# Patient Record
Sex: Female | Born: 1955
Health system: Southern US, Community
[De-identification: ages and names within clinical notes are randomized; demographics above are authoritative.]

## PROBLEM LIST (undated history)

## (undated) DIAGNOSIS — Z95810 Presence of automatic (implantable) cardiac defibrillator: Secondary | ICD-10-CM

## (undated) DIAGNOSIS — I5032 Chronic diastolic (congestive) heart failure: Secondary | ICD-10-CM

## (undated) DIAGNOSIS — Z5189 Encounter for other specified aftercare: Secondary | ICD-10-CM

## (undated) DIAGNOSIS — M1990 Unspecified osteoarthritis, unspecified site: Secondary | ICD-10-CM

## (undated) DIAGNOSIS — E539 Vitamin B deficiency, unspecified: Secondary | ICD-10-CM

## (undated) DIAGNOSIS — R55 Syncope and collapse: Secondary | ICD-10-CM

## (undated) DIAGNOSIS — N289 Disorder of kidney and ureter, unspecified: Secondary | ICD-10-CM

## (undated) DIAGNOSIS — R002 Palpitations: Secondary | ICD-10-CM

## (undated) DIAGNOSIS — J45909 Unspecified asthma, uncomplicated: Secondary | ICD-10-CM

## (undated) DIAGNOSIS — I421 Obstructive hypertrophic cardiomyopathy: Secondary | ICD-10-CM

## (undated) DIAGNOSIS — I4891 Unspecified atrial fibrillation: Secondary | ICD-10-CM

## (undated) DIAGNOSIS — M8580 Other specified disorders of bone density and structure, unspecified site: Secondary | ICD-10-CM

## (undated) DIAGNOSIS — Z9889 Other specified postprocedural states: Secondary | ICD-10-CM

## (undated) DIAGNOSIS — E785 Hyperlipidemia, unspecified: Secondary | ICD-10-CM

## (undated) DIAGNOSIS — F419 Anxiety disorder, unspecified: Secondary | ICD-10-CM

## (undated) HISTORY — DX: Encounter for other specified aftercare: Z51.89

## (undated) HISTORY — PX: EYE SURGERY: SHX253

## (undated) HISTORY — DX: Obstructive hypertrophic cardiomyopathy: I42.1

## (undated) HISTORY — PX: BREAST CYST ASPIRATION: SHX578

## (undated) HISTORY — PX: TONSILLECTOMY AND ADENOIDECTOMY: SHX28

## (undated) HISTORY — DX: Unspecified asthma, uncomplicated: J45.909

## (undated) HISTORY — DX: Disorder of kidney and ureter, unspecified: N28.9

## (undated) HISTORY — DX: Other specified disorders of bone density and structure, unspecified site: M85.80

## (undated) HISTORY — DX: Hyperlipidemia, unspecified: E78.5

## (undated) HISTORY — PX: UMBILICAL HERNIA REPAIR: SHX196

## (undated) HISTORY — PX: HERNIA REPAIR: SHX51

## (undated) HISTORY — DX: Unspecified osteoarthritis, unspecified site: M19.90

## (undated) HISTORY — DX: Other specified postprocedural states: Z98.890

## (undated) HISTORY — PX: BONY PELVIS SURGERY: SHX572

## (undated) HISTORY — DX: Presence of automatic (implantable) cardiac defibrillator: Z95.810

## (undated) HISTORY — DX: Anxiety disorder, unspecified: F41.9

## (undated) HISTORY — DX: Vitamin B deficiency, unspecified: E53.9

## (undated) HISTORY — PX: BREAST BIOPSY: SHX20

## (undated) HISTORY — DX: Palpitations: R00.2

## (undated) HISTORY — DX: Unspecified atrial fibrillation: I48.91

---
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
## 2012-07-07 ENCOUNTER — Ambulatory Visit: Payer: Self-pay | Admitting: Internal Medicine

## 2012-10-18 ENCOUNTER — Ambulatory Visit: Payer: Self-pay | Admitting: Internal Medicine

## 2014-06-24 ENCOUNTER — Ambulatory Visit: Payer: Self-pay | Admitting: Internal Medicine

## 2014-11-22 ENCOUNTER — Other Ambulatory Visit (INDEPENDENT_AMBULATORY_CARE_PROVIDER_SITE_OTHER): Payer: BLUE CROSS/BLUE SHIELD

## 2014-11-22 DIAGNOSIS — Z23 Encounter for immunization: Secondary | ICD-10-CM | POA: Diagnosis not present

## 2015-01-30 HISTORY — PX: CARDIAC DEFIBRILLATOR PLACEMENT: SHX171

## 2015-02-22 DIAGNOSIS — S329XXA Fracture of unspecified parts of lumbosacral spine and pelvis, initial encounter for closed fracture: Secondary | ICD-10-CM | POA: Insufficient documentation

## 2015-02-23 DIAGNOSIS — R55 Syncope and collapse: Secondary | ICD-10-CM

## 2015-02-23 HISTORY — DX: Syncope and collapse: R55

## 2015-02-27 DIAGNOSIS — I421 Obstructive hypertrophic cardiomyopathy: Secondary | ICD-10-CM | POA: Insufficient documentation

## 2015-03-05 ENCOUNTER — Other Ambulatory Visit: Payer: Self-pay | Admitting: Internal Medicine

## 2015-03-05 DIAGNOSIS — M810 Age-related osteoporosis without current pathological fracture: Secondary | ICD-10-CM | POA: Insufficient documentation

## 2015-03-05 DIAGNOSIS — E785 Hyperlipidemia, unspecified: Secondary | ICD-10-CM | POA: Insufficient documentation

## 2015-03-05 DIAGNOSIS — M8580 Other specified disorders of bone density and structure, unspecified site: Secondary | ICD-10-CM

## 2015-03-05 DIAGNOSIS — Z87898 Personal history of other specified conditions: Secondary | ICD-10-CM | POA: Insufficient documentation

## 2015-03-06 ENCOUNTER — Encounter: Payer: Self-pay | Admitting: Internal Medicine

## 2015-03-06 DIAGNOSIS — J4520 Mild intermittent asthma, uncomplicated: Secondary | ICD-10-CM | POA: Insufficient documentation

## 2015-03-07 ENCOUNTER — Ambulatory Visit (INDEPENDENT_AMBULATORY_CARE_PROVIDER_SITE_OTHER): Payer: BLUE CROSS/BLUE SHIELD | Admitting: Internal Medicine

## 2015-03-07 ENCOUNTER — Encounter: Payer: Self-pay | Admitting: Internal Medicine

## 2015-03-07 VITALS — BP 110/60 | HR 72 | Ht 65.0 in | Wt 143.0 lb

## 2015-03-07 DIAGNOSIS — S3289XA Fracture of other parts of pelvis, initial encounter for closed fracture: Secondary | ICD-10-CM

## 2015-03-07 DIAGNOSIS — I421 Obstructive hypertrophic cardiomyopathy: Secondary | ICD-10-CM | POA: Diagnosis not present

## 2015-03-07 DIAGNOSIS — IMO0001 Reserved for inherently not codable concepts without codable children: Secondary | ICD-10-CM | POA: Insufficient documentation

## 2015-03-07 DIAGNOSIS — J4520 Mild intermittent asthma, uncomplicated: Secondary | ICD-10-CM | POA: Diagnosis not present

## 2015-03-07 DIAGNOSIS — F419 Anxiety disorder, unspecified: Secondary | ICD-10-CM

## 2015-03-07 DIAGNOSIS — F32A Depression, unspecified: Secondary | ICD-10-CM | POA: Insufficient documentation

## 2015-03-07 DIAGNOSIS — F430 Acute stress reaction: Secondary | ICD-10-CM

## 2015-03-07 DIAGNOSIS — F324 Major depressive disorder, single episode, in partial remission: Secondary | ICD-10-CM | POA: Insufficient documentation

## 2015-03-07 DIAGNOSIS — F329 Major depressive disorder, single episode, unspecified: Secondary | ICD-10-CM | POA: Insufficient documentation

## 2015-03-07 MED ORDER — OXYCODONE HCL 5 MG PO TABS: 5.0000 mg | ORAL_TABLET | ORAL | Status: DC | PRN

## 2015-03-07 MED ORDER — ONDANSETRON HCL 4 MG PO TABS: 4.0000 mg | ORAL_TABLET | Freq: Three times a day (TID) | ORAL | Status: DC | PRN

## 2015-03-07 MED ORDER — SERTRALINE HCL 25 MG PO TABS: 25.0000 mg | ORAL_TABLET | Freq: Every day | ORAL | Status: DC

## 2015-03-07 MED ORDER — LORAZEPAM 0.5 MG PO TABS: 0.5000 mg | ORAL_TABLET | Freq: Every day | ORAL | Status: DC
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
## 2015-03-08 LAB — CBC WITH DIFFERENTIAL/PLATELET
Basophils Absolute: 0 10*3/uL (ref 0.0–0.2)
Basos: 1 %
EOS (ABSOLUTE): 0.1 10*3/uL (ref 0.0–0.4)
EOS: 2 %
HEMATOCRIT: 31.9 % — AB (ref 34.0–46.6)
HEMOGLOBIN: 10.2 g/dL — AB (ref 11.1–15.9)
IMMATURE GRANS (ABS): 0 10*3/uL (ref 0.0–0.1)
IMMATURE GRANULOCYTES: 0 %
Lymphocytes Absolute: 1.1 10*3/uL (ref 0.7–3.1)
Lymphs: 18 %
MCH: 30.3 pg (ref 26.6–33.0)
MCHC: 32 g/dL (ref 31.5–35.7)
MCV: 95 fL (ref 79–97)
Monocytes Absolute: 0.4 10*3/uL (ref 0.1–0.9)
Monocytes: 7 %
NEUTROS PCT: 72 %
Neutrophils Absolute: 4.3 10*3/uL (ref 1.4–7.0)
Platelets: 362 10*3/uL (ref 150–379)
RBC: 3.37 x10E6/uL — AB (ref 3.77–5.28)
RDW: 13.5 % (ref 12.3–15.4)
WBC: 5.9 10*3/uL (ref 3.4–10.8)

## 2015-03-08 LAB — COMPREHENSIVE METABOLIC PANEL
ALBUMIN: 3.7 g/dL (ref 3.6–4.8)
ALT: 38 IU/L — ABNORMAL HIGH (ref 0–32)
AST: 20 IU/L (ref 0–40)
Albumin/Globulin Ratio: 1.9 (ref 1.1–2.5)
Alkaline Phosphatase: 224 IU/L — ABNORMAL HIGH (ref 39–117)
BUN / CREAT RATIO: 14 (ref 11–26)
BUN: 11 mg/dL (ref 8–27)
Bilirubin Total: 0.4 mg/dL (ref 0.0–1.2)
CALCIUM: 8.9 mg/dL (ref 8.7–10.3)
CO2: 27 mmol/L (ref 18–29)
CREATININE: 0.8 mg/dL (ref 0.57–1.00)
Chloride: 102 mmol/L (ref 97–108)
GFR calc Af Amer: 92 mL/min/{1.73_m2} (ref >59)
GFR, EST NON AFRICAN AMERICAN: 80 mL/min/{1.73_m2} (ref >59)
GLOBULIN, TOTAL: 2 g/dL (ref 1.5–4.5)
Glucose: 94 mg/dL (ref 65–99)
Potassium: 4.8 mmol/L (ref 3.5–5.2)
SODIUM: 143 mmol/L (ref 134–144)
TOTAL PROTEIN: 5.7 g/dL — AB (ref 6.0–8.5)

## 2015-03-13 ENCOUNTER — Encounter (INDEPENDENT_AMBULATORY_CARE_PROVIDER_SITE_OTHER): Payer: BLUE CROSS/BLUE SHIELD | Admitting: Internal Medicine

## 2015-03-13 ENCOUNTER — Encounter: Payer: Self-pay | Admitting: Internal Medicine

## 2015-03-13 DIAGNOSIS — S3289XA Fracture of other parts of pelvis, initial encounter for closed fracture: Secondary | ICD-10-CM

## 2015-03-13 DIAGNOSIS — I421 Obstructive hypertrophic cardiomyopathy: Secondary | ICD-10-CM

## 2015-03-13 DIAGNOSIS — Z95810 Presence of automatic (implantable) cardiac defibrillator: Secondary | ICD-10-CM

## 2015-03-13 DIAGNOSIS — R55 Syncope and collapse: Secondary | ICD-10-CM

## 2015-03-13 HISTORY — DX: Presence of automatic (implantable) cardiac defibrillator: Z95.810

## 2015-03-13 NOTE — Progress Notes (Signed)
Patient ID: Carmen Graves, female   DOB: [birth year], 59 y.o.   MRN: [id number]  Home health orders from [facility] are received. Start of care date 02/28/2015. Certification from 57/50/5183 through 04/28/2015. Orders are reviewed and signed and dated. Orders will be faxed to the agency.

## 2015-03-21 ENCOUNTER — Telehealth: Payer: Self-pay

## 2015-03-21 ENCOUNTER — Other Ambulatory Visit: Payer: Self-pay | Admitting: Internal Medicine

## 2015-03-21 DIAGNOSIS — S3289XA Fracture of other parts of pelvis, initial encounter for closed fracture: Secondary | ICD-10-CM

## 2015-03-21 MED ORDER — LORAZEPAM 0.5 MG PO TABS: 0.5000 mg | ORAL_TABLET | Freq: Two times a day (BID) | ORAL | Status: AC | PRN

## 2015-03-21 NOTE — Telephone Encounter (Signed)
Patient states having a lot of anxiety, cannot ride in car without being very anxious, nauseated, long halls at [facility] with no windows make her anxious as do exam room with no windows. Needs Lorazepam more than once a day. Can you call in new Rx.dr

## 2015-04-04 ENCOUNTER — Other Ambulatory Visit: Payer: Self-pay

## 2015-04-04 ENCOUNTER — Telehealth: Payer: Self-pay

## 2015-04-04 DIAGNOSIS — S3289XA Fracture of other parts of pelvis, initial encounter for closed fracture: Secondary | ICD-10-CM

## 2015-04-04 MED ORDER — ONDANSETRON HCL 4 MG PO TABS: 4.0000 mg | ORAL_TABLET | Freq: Three times a day (TID) | ORAL | Status: DC | PRN

## 2015-04-04 NOTE — Telephone Encounter (Signed)
Called about Zofran but was already called in. I informed patient  To check pharmacy. [facility]

## 2015-05-08 DIAGNOSIS — R898 Other abnormal findings in specimens from other organs, systems and tissues: Secondary | ICD-10-CM | POA: Insufficient documentation

## 2015-06-01 HISTORY — PX: MYOMECTOMY: SHX85

## 2015-06-20 ENCOUNTER — Other Ambulatory Visit: Payer: Self-pay | Admitting: Internal Medicine

## 2015-06-20 ENCOUNTER — Encounter: Payer: Self-pay | Admitting: Internal Medicine

## 2015-06-20 DIAGNOSIS — I480 Paroxysmal atrial fibrillation: Secondary | ICD-10-CM | POA: Insufficient documentation

## 2015-06-24 DIAGNOSIS — Z95810 Presence of automatic (implantable) cardiac defibrillator: Secondary | ICD-10-CM | POA: Insufficient documentation

## 2015-07-15 ENCOUNTER — Ambulatory Visit: Payer: BLUE CROSS/BLUE SHIELD | Admitting: Internal Medicine

## 2015-07-18 ENCOUNTER — Ambulatory Visit (INDEPENDENT_AMBULATORY_CARE_PROVIDER_SITE_OTHER): Payer: BLUE CROSS/BLUE SHIELD | Admitting: Internal Medicine

## 2015-07-18 ENCOUNTER — Encounter: Payer: Self-pay | Admitting: Internal Medicine

## 2015-07-18 ENCOUNTER — Other Ambulatory Visit: Payer: Self-pay | Admitting: Internal Medicine

## 2015-07-18 VITALS — BP 104/62 | HR 64 | Ht 65.0 in | Wt 134.2 lb

## 2015-07-18 DIAGNOSIS — F32A Depression, unspecified: Secondary | ICD-10-CM

## 2015-07-18 DIAGNOSIS — F418 Other specified anxiety disorders: Secondary | ICD-10-CM | POA: Diagnosis not present

## 2015-07-18 DIAGNOSIS — Z8679 Personal history of other diseases of the circulatory system: Secondary | ICD-10-CM

## 2015-07-18 DIAGNOSIS — I421 Obstructive hypertrophic cardiomyopathy: Secondary | ICD-10-CM | POA: Diagnosis not present

## 2015-07-18 DIAGNOSIS — Z95810 Presence of automatic (implantable) cardiac defibrillator: Secondary | ICD-10-CM

## 2015-07-18 DIAGNOSIS — F419 Anxiety disorder, unspecified: Secondary | ICD-10-CM

## 2015-07-18 DIAGNOSIS — I4891 Unspecified atrial fibrillation: Secondary | ICD-10-CM

## 2015-07-18 DIAGNOSIS — S3289XD Fracture of other parts of pelvis, subsequent encounter for fracture with routine healing: Secondary | ICD-10-CM

## 2015-07-18 DIAGNOSIS — F329 Major depressive disorder, single episode, unspecified: Secondary | ICD-10-CM
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
## 2015-09-29 DIAGNOSIS — I421 Obstructive hypertrophic cardiomyopathy: Secondary | ICD-10-CM | POA: Diagnosis not present

## 2015-10-03 DIAGNOSIS — Z4502 Encounter for adjustment and management of automatic implantable cardiac defibrillator: Secondary | ICD-10-CM | POA: Diagnosis not present

## 2015-10-03 DIAGNOSIS — Z79899 Other long term (current) drug therapy: Secondary | ICD-10-CM | POA: Diagnosis not present

## 2015-10-03 DIAGNOSIS — I421 Obstructive hypertrophic cardiomyopathy: Secondary | ICD-10-CM | POA: Diagnosis not present

## 2015-10-03 DIAGNOSIS — Z95810 Presence of automatic (implantable) cardiac defibrillator: Secondary | ICD-10-CM | POA: Diagnosis not present

## 2015-10-13 DIAGNOSIS — Z95810 Presence of automatic (implantable) cardiac defibrillator: Secondary | ICD-10-CM | POA: Diagnosis not present

## 2015-10-13 DIAGNOSIS — I421 Obstructive hypertrophic cardiomyopathy: Secondary | ICD-10-CM | POA: Diagnosis not present

## 2015-10-13 DIAGNOSIS — Z7901 Long term (current) use of anticoagulants: Secondary | ICD-10-CM | POA: Diagnosis not present

## 2015-10-13 DIAGNOSIS — Z79899 Other long term (current) drug therapy: Secondary | ICD-10-CM | POA: Diagnosis not present

## 2015-10-16 DIAGNOSIS — I421 Obstructive hypertrophic cardiomyopathy: Secondary | ICD-10-CM | POA: Diagnosis not present

## 2015-10-23 ENCOUNTER — Other Ambulatory Visit: Payer: Self-pay | Admitting: Internal Medicine

## 2016-01-09 DIAGNOSIS — Z45018 Encounter for adjustment and management of other part of cardiac pacemaker: Secondary | ICD-10-CM | POA: Diagnosis not present

## 2016-01-09 DIAGNOSIS — Z95810 Presence of automatic (implantable) cardiac defibrillator: Secondary | ICD-10-CM | POA: Diagnosis not present

## 2016-01-15 ENCOUNTER — Encounter (INDEPENDENT_AMBULATORY_CARE_PROVIDER_SITE_OTHER): Payer: Self-pay

## 2016-01-15 ENCOUNTER — Ambulatory Visit (INDEPENDENT_AMBULATORY_CARE_PROVIDER_SITE_OTHER): Payer: BLUE CROSS/BLUE SHIELD | Admitting: Internal Medicine

## 2016-01-15 ENCOUNTER — Encounter: Payer: Self-pay | Admitting: Internal Medicine

## 2016-01-15 VITALS — BP 102/78 | HR 78 | Resp 16 | Ht 64.0 in | Wt 136.0 lb

## 2016-01-15 DIAGNOSIS — Z0000 Encounter for general adult medical examination without abnormal findings: Secondary | ICD-10-CM

## 2016-01-15 DIAGNOSIS — I480 Paroxysmal atrial fibrillation: Secondary | ICD-10-CM

## 2016-01-15 DIAGNOSIS — M810 Age-related osteoporosis without current pathological fracture: Secondary | ICD-10-CM | POA: Diagnosis not present

## 2016-01-15 DIAGNOSIS — Z1211 Encounter for screening for malignant neoplasm of colon: Secondary | ICD-10-CM

## 2016-01-15 DIAGNOSIS — F418 Other specified anxiety disorders: Secondary | ICD-10-CM | POA: Diagnosis not present

## 2016-01-15 DIAGNOSIS — Z23 Encounter for immunization: Secondary | ICD-10-CM

## 2016-01-15 DIAGNOSIS — I421 Obstructive hypertrophic cardiomyopathy: Secondary | ICD-10-CM

## 2016-01-15 DIAGNOSIS — Z1239 Encounter for other screening for malignant neoplasm of breast: Secondary | ICD-10-CM | POA: Diagnosis not present

## 2016-01-15 DIAGNOSIS — Z8679 Personal history of other diseases of the circulatory system: Secondary | ICD-10-CM | POA: Diagnosis not present

## 2016-01-15 DIAGNOSIS — F329 Major depressive disorder, single episode, unspecified: Secondary | ICD-10-CM

## 2016-01-15 DIAGNOSIS — Z1159 Encounter for screening for other viral diseases: Secondary | ICD-10-CM

## 2016-01-15 DIAGNOSIS — F419 Anxiety disorder, unspecified: Secondary | ICD-10-CM

## 2016-01-15 DIAGNOSIS — F32A Depression, unspecified: Secondary | ICD-10-CM

## 2016-01-15 NOTE — Patient Instructions (Addendum)
Pneumococcal Conjugate Vaccine (PCV13)   1. Why get vaccinated?  Vaccination can protect both children and adults from pneumococcal disease.  Pneumococcal disease is caused by bacteria that can spread from person to person through close contact. It can cause ear infections, and it can also lead to more serious infections of the:  · Lungs (pneumonia),  · Blood (bacteremia), and  · Covering of the brain and spinal cord (meningitis).  Pneumococcal pneumonia is most common among adults. Pneumococcal meningitis can cause deafness and brain damage, and it kills about 1 child in 10 who get it.  Anyone can get pneumococcal disease, but children under 2 years of age and adults 65 years and older, people with certain medical conditions, and cigarette smokers are at the highest risk.  Before there was a vaccine, the [place] saw:  · more than 700 cases of meningitis,  · about 13,000 blood infections,  · about 5 million ear infections, and  · about 200 deaths  in children under 5 each year from pneumococcal disease. Since vaccine became available, severe pneumococcal disease in these children has fallen by 88%.  About 18,000 older adults die of pneumococcal disease each year in the [place].  Treatment of pneumococcal infections with penicillin and other drugs is not as effective as it used to be, because some strains of the disease have become resistant to these drugs. This makes prevention of the disease, through vaccination, even more important.  2. PCV13 vaccine  Pneumococcal conjugate vaccine (called PCV13) protects against 13 types of pneumococcal bacteria.  PCV13 is routinely given to children at 2, 4, 6, and 12-15 months of age. It is also recommended for children and adults 2 to 64 years of age with certain health conditions, and for all adults 65 years of age and older. Your doctor can give you details.  3. Some people should not get this vaccine  Anyone who has ever had a life-threatening allergic reaction  to a dose of this vaccine, to an earlier pneumococcal vaccine called PCV7, or to any vaccine containing diphtheria toxoid (for example, DTaP), should not get PCV13.  Anyone with a severe allergy to any component of PCV13 should not get the vaccine. Tell your doctor if the person being vaccinated has any severe allergies.  If the person scheduled for vaccination is not feeling well, your healthcare provider might decide to reschedule the shot on another day.  4. Risks of a vaccine reaction  With any medicine, including vaccines, there is a chance of reactions. These are usually mild and go away on their own, but serious reactions are also possible.  Problems reported following PCV13 varied by age and dose in the series. The most common problems reported among children were:  · About half became drowsy after the shot, had a temporary loss of appetite, or had redness or tenderness where the shot was given.  · About 1 out of 3 had swelling where the shot was given.  · About 1 out of 3 had a mild fever, and about 1 in 20 had a fever over 102.2°F.  · Up to about 8 out of 10 became fussy or irritable.  Adults have reported pain, redness, and swelling where the shot was given; also mild fever, fatigue, headache, chills, or muscle pain.  Young children who get PCV13 along with inactivated flu vaccine at the same time may be at increased risk for seizures caused by fever. Ask your doctor for more information.  Problems that   could happen after any vaccine:  · People sometimes faint after a medical procedure, including vaccination. Sitting or lying down for about 15 minutes can help prevent fainting, and injuries caused by a fall. Tell your doctor if you feel dizzy, or have vision changes or ringing in the ears.  · Some older children and adults get severe pain in the shoulder and have difficulty moving the arm where a shot was given. This happens very rarely.  · Any medication can cause a severe allergic reaction. Such  reactions from a vaccine are very rare, estimated at about 1 in a million doses, and would happen within a few minutes to a few hours after the vaccination.  As with any medicine, there is a very small chance of a vaccine causing a serious injury or death.  The safety of vaccines is always being monitored. For more information, visit: www.cdc.gov/vaccinesafety/  5. What if there is a serious reaction?  What should I look for?  · Look for anything that concerns you, such as signs of a severe allergic reaction, very high fever, or unusual behavior.  Signs of a severe allergic reaction can include hives, swelling of the face and throat, difficulty breathing, a fast heartbeat, dizziness, and weakness-usually within a few minutes to a few hours after the vaccination.  What should I do?  · If you think it is a severe allergic reaction or other emergency that can't wait, call 9-1-1 or get the person to the nearest hospital. Otherwise, call your doctor.  Reactions should be reported to the Vaccine Adverse Event Reporting System (VAERS). Your doctor should file this report, or you can do it yourself through the VAERS web site at www.vaers.hhs.gov, or by calling 1-800-822-7967.  VAERS does not give medical advice.  6. The National Vaccine Injury Compensation Program  The National Vaccine Injury Compensation Program (VICP) is a federal program that was created to compensate people who may have been injured by certain vaccines.  Persons who believe they may have been injured by a vaccine can learn about the program and about filing a claim by calling 1-800-338-2382 or visiting the VICP website at www.hrsa.gov/vaccinecompensation. There is a time limit to file a claim for compensation.  7. How can I learn more?  · Ask your healthcare provider. He or she can give you the vaccine package insert or suggest other sources of information.  · Call your local or state health department.  · Contact the Centers for Disease Control and  Prevention (CDC):    Call 1-800-232-4636 (1-800-CDC-INFO) or    Visit CDC's website at www.cdc.gov/vaccines  Vaccine Information Statement  PCV13 Vaccine (04/04/2014)     This information is not intended to replace advice given to you by your health care provider. Make sure you discuss any questions you have with your health care provider.     Document Released: 03/14/2006 Document Revised: 06/07/2014 Document Reviewed: 04/11/2014  Elsevier Interactive Patient Education ©2016 [employer]

## 2016-01-15 NOTE — Progress Notes (Signed)
Date:  01/15/2016   Name:  Carmen Graves   DOB:  1955/07/22   MRN:  [id number]  Chief Complaint: Annual Exam (DeclinesPAP nad Mammo will do at OB/GYN) Carmen Graves is a 60 y.o. female who presents today for her Complete Annual Exam. She feels fairly well. She reports exercising very little due to heart condition.. She reports she is sleeping fairly well.  She plans to go to the OB-GYN for Pap and Mammogram.  HOCM - she is doing much better on medication and with a defibrillator in place. However she continues to have significant fatigue shortness of breath with much exertion. She continues to see the cardiologist and the specialist. She is considering open heart surgery for correction. She underwent genetic testing which showed a genetic abnormality as the cause of her cardiomyopathy. Her 2 daughters have been tested one is positive. The one that is positive recently had a baby who is also being tested.  Depression - her depression is very well controlled on Zoloft. She has good appetite and sleep with no adverse side effects. She will like to continue taking this medication.  Review of Systems  Constitutional: Positive for fatigue. Negative for chills and fever.  HENT: Negative for congestion, hearing loss, tinnitus, trouble swallowing and voice change.   Eyes: Negative for visual disturbance.  Respiratory: Positive for shortness of breath (on exertion). Negative for cough, chest tightness and wheezing.   Cardiovascular: Negative for chest pain, palpitations and leg swelling.  Gastrointestinal: Negative for abdominal pain, constipation, diarrhea and vomiting.  Endocrine: Negative for polydipsia and polyuria.  Genitourinary: Negative for dysuria, frequency, genital sores, vaginal bleeding and vaginal discharge.  Musculoskeletal: Negative for arthralgias, gait problem and joint swelling.  Skin: Negative for color change and rash.  Neurological: Negative for dizziness, tremors,  light-headedness and headaches.  Hematological: Negative for adenopathy. Does not bruise/bleed easily.  Psychiatric/Behavioral: Negative for dysphoric mood and sleep disturbance. The patient is not nervous/anxious.     Patient Active Problem List   Diagnosis Date Noted  . Paroxysmal atrial fibrillation ([place]) 06/20/2015  . Abnormal genetic test 05/08/2015  . Presence of automatic cardioverter/defibrillator (AICD) 03/13/2015  . Anxiety and depression 03/07/2015  . Hyperlipidemia, mild 03/05/2015  . Osteopenia 03/05/2015  . History of palpitations 03/05/2015  . Hypertrophic obstructive cardiomyopathy ([place]) 02/27/2015  . Neurocardiogenic syncope 02/23/2015    Prior to Admission medications   Medication Sig Start Date End Date Taking? Authorizing Provider  apixaban (ELIQUIS) 5 MG TABS tablet Take 1 tablet by mouth 2 (two) times daily. 05/02/15  Yes Historical Provider, MD  Bilberry, Vaccinium myrtillus, 1000 MG CAPS Take 1 capsule by mouth daily.   Yes Historical Provider, MD  BOSWELLIA SERRATA PO Take 1 capsule by mouth.   Yes Historical Provider, MD  Collagen 500 MG CAPS Take by mouth.   Yes Historical Provider, MD  cycloSPORINE (RESTASIS) 0.05 % ophthalmic emulsion 1 drop 2 (two) times daily.   Yes Historical Provider, MD  disopyramide (NORPACE) 100 MG capsule Take 1 capsule by mouth 3 (three) times daily. 05/02/15 05/01/16 Yes Historical Provider, MD  Ergocalciferol (VITAMIN D2) 2000 UNITS TABS Take by mouth.   Yes Historical Provider, MD  Flavocoxid-Cit Zn Bisglcinate (LIMBREL500) 500-50 MG CAPS 2 cap po QAM, then 1 po HS. 01/21/15  Yes Historical Provider, MD  Glucosamine HCl 1000 MG TABS Take 1 tablet by mouth daily.   Yes Historical Provider, MD  Magnesium 250 MG TABS Take by mouth.   Yes  Historical Provider, MD  metoprolol succinate (TOPROL-XL) 100 MG 24 hr tablet Take 1 tablet by mouth daily. 06/29/15  Yes Historical Provider, MD  [facility] Extract 95 % POWD by Does not apply route.    Yes Historical Provider, MD  Resveratrol 100 MG CAPS Take 1 capsule by mouth daily.   Yes Historical Provider, MD  S-Adenosylmethionine (SAM-E) 400 MG TABS Take by mouth.   Yes Historical Provider, MD  sertraline (ZOLOFT) 25 MG tablet TAKE 1 TABLET (25 MG TOTAL) BY MOUTH DAILY. 10/24/15  Yes Glean Hess, MD    Allergies  Allergen Reactions  . Albuterol Shortness Of Breath    Chest pain and SOB  . Budesonide-Formoterol Fumarate Shortness Of Breath    Chest pain, severe SOB   . Fluticasone-Salmeterol Shortness Of Breath  . Ipratropium-Albuterol Shortness Of Breath    Chest pain  . Codeine Nausea Only  . Prednisone Other (See Comments)    Chest pain and tightness    Past Surgical History:  Procedure Laterality Date  . BONY PELVIS SURGERY     tore muscle from pelvic bone  . CARDIAC DEFIBRILLATOR PLACEMENT  01/2015   for HOCM  . TONSILLECTOMY AND ADENOIDECTOMY    . UMBILICAL HERNIA REPAIR      Social History  Substance Use Topics  . Smoking status: Never Smoker  . Smokeless tobacco: Never Used  . Alcohol use No     Medication list has been reviewed and updated.   Physical Exam  Constitutional: She is oriented to person, place, and time. She appears well-developed and well-nourished. No distress.  HENT:  Head: Normocephalic and atraumatic.  Right Ear: Tympanic membrane and ear canal normal.  Left Ear: Tympanic membrane and ear canal normal.  Nose: Right sinus exhibits no maxillary sinus tenderness. Left sinus exhibits no maxillary sinus tenderness.  Mouth/Throat: Uvula is midline and oropharynx is clear and moist.  Eyes: Conjunctivae and EOM are normal. Right eye exhibits no discharge. Left eye exhibits no discharge. No scleral icterus.  Neck: Normal range of motion. Carotid bruit is not present. No erythema present. No thyromegaly present.  Cardiovascular: Normal rate, regular rhythm, normal heart sounds and normal pulses.   Pulmonary/Chest: Effort normal. No  respiratory distress. She has no wheezes.    Abdominal: Soft. Bowel sounds are normal. There is no hepatosplenomegaly. There is no tenderness. There is no CVA tenderness.  Musculoskeletal: Normal range of motion.  Lymphadenopathy:    She has no cervical adenopathy.    She has no axillary adenopathy.  Neurological: She is alert and oriented to person, place, and time. She has normal reflexes. No cranial nerve deficit or sensory deficit.  Skin: Skin is warm, dry and intact. No rash noted.  Psychiatric: She has a normal mood and affect. Her speech is normal and behavior is normal. Thought content normal.  Nursing note and vitals reviewed.   BP 102/78 (BP Location: Right Arm, Patient Position: Sitting, Cuff Size: Normal)   Pulse 78   Resp 16   Ht 5\' 5"  (1.651 m)   Wt 136 lb (61.7 kg)   SpO2 97%   BMI 22.63 kg/m   Assessment and Plan: 1. Annual physical exam - Lipid panel - TSH - POCT urinalysis dipstick  2. Breast cancer screening Done through GYN  3. Colon cancer screening - Cologuard  4. Hypertrophic obstructive cardiomyopathy ([place]) AICD in place; considering surgical correction Follow up with cardiology and [facility]  5. Anxiety and depression Doing well - continue medication -  Comprehensive metabolic panel - TSH  6. Paroxysmal atrial fibrillation (HCC) On Eliquis with no bleeding complications - CBC with Differential/Platelet  7. Osteoporosis S/p treatment - followed by Dr. Gale Journey  8. Need for pneumococcal vaccination - Pneumococcal conjugate vaccine 13-valent IM  9. Need for hepatitis C screening test - Hepatitis C antibody   Halina Maidens, MD [facility]  01/15/2016

## 2016-01-16 LAB — COMPREHENSIVE METABOLIC PANEL
ALT: 34 IU/L — ABNORMAL HIGH (ref 0–32)
AST: 25 IU/L (ref 0–40)
Albumin/Globulin Ratio: 2.1 (ref 1.2–2.2)
Albumin: 4.5 g/dL (ref 3.6–4.8)
Alkaline Phosphatase: 98 IU/L (ref 39–117)
BILIRUBIN TOTAL: 0.5 mg/dL (ref 0.0–1.2)
BUN/Creatinine Ratio: 19 (ref 12–28)
BUN: 13 mg/dL (ref 8–27)
CO2: 21 mmol/L (ref 18–29)
CREATININE: 0.7 mg/dL (ref 0.57–1.00)
Calcium: 9.3 mg/dL (ref 8.7–10.3)
Chloride: 105 mmol/L (ref 96–106)
GFR, EST AFRICAN AMERICAN: 107 mL/min/{1.73_m2} (ref >59)
GFR, EST NON AFRICAN AMERICAN: 93 mL/min/{1.73_m2} (ref >59)
GLUCOSE: 82 mg/dL (ref 65–99)
Globulin, Total: 2.1 g/dL (ref 1.5–4.5)
POTASSIUM: 4.3 mmol/L (ref 3.5–5.2)
SODIUM: 146 mmol/L — AB (ref 134–144)
TOTAL PROTEIN: 6.6 g/dL (ref 6.0–8.5)

## 2016-01-16 LAB — CBC WITH DIFFERENTIAL/PLATELET
BASOS ABS: 0 10*3/uL (ref 0.0–0.2)
Basos: 0 %
EOS (ABSOLUTE): 0.1 10*3/uL (ref 0.0–0.4)
Eos: 2 %
Hematocrit: 41.4 % (ref 34.0–46.6)
Hemoglobin: 13.6 g/dL (ref 11.1–15.9)
IMMATURE GRANS (ABS): 0 10*3/uL (ref 0.0–0.1)
IMMATURE GRANULOCYTES: 0 %
LYMPHS: 24 %
Lymphocytes Absolute: 1.1 10*3/uL (ref 0.7–3.1)
MCH: 30.5 pg (ref 26.6–33.0)
MCHC: 32.9 g/dL (ref 31.5–35.7)
MCV: 93 fL (ref 79–97)
Monocytes Absolute: 0.3 10*3/uL (ref 0.1–0.9)
Monocytes: 6 %
NEUTROS ABS: 3.1 10*3/uL (ref 1.4–7.0)
NEUTROS PCT: 68 %
PLATELETS: 212 10*3/uL (ref 150–379)
RBC: 4.46 x10E6/uL (ref 3.77–5.28)
RDW: 13.9 % (ref 12.3–15.4)
WBC: 4.6 10*3/uL (ref 3.4–10.8)

## 2016-01-16 LAB — HEPATITIS C ANTIBODY: Hep C Virus Ab: <0.1 s/co ratio (ref 0.0–0.9)

## 2016-01-16 LAB — LIPID PANEL
CHOL/HDL RATIO: 4.9 ratio — AB (ref 0.0–4.4)
Cholesterol, Total: 229 mg/dL — ABNORMAL HIGH (ref 100–199)
HDL: 47 mg/dL (ref >39)
LDL CALC: 144 mg/dL — AB (ref 0–99)
TRIGLYCERIDES: 190 mg/dL — AB (ref 0–149)
VLDL CHOLESTEROL CAL: 38 mg/dL (ref 5–40)

## 2016-01-16 LAB — TSH: TSH: 0.652 u[IU]/mL (ref 0.450–4.500)

## 2016-03-15 DIAGNOSIS — I421 Obstructive hypertrophic cardiomyopathy: Secondary | ICD-10-CM | POA: Diagnosis not present

## 2016-03-15 DIAGNOSIS — Z23 Encounter for immunization: Secondary | ICD-10-CM | POA: Diagnosis not present

## 2016-03-17 ENCOUNTER — Other Ambulatory Visit: Payer: Self-pay | Admitting: Internal Medicine

## 2016-03-17 ENCOUNTER — Ambulatory Visit
Admission: RE | Admit: 2016-03-17 | Discharge: 2016-03-17 | Disposition: A | Discharge status: Home / Self Care | Payer: BLUE CROSS/BLUE SHIELD | Source: Ambulatory Visit | Attending: Internal Medicine | Admitting: Internal Medicine

## 2016-03-17 DIAGNOSIS — R928 Other abnormal and inconclusive findings on diagnostic imaging of breast: Secondary | ICD-10-CM | POA: Diagnosis not present

## 2016-03-17 DIAGNOSIS — Z1231 Encounter for screening mammogram for malignant neoplasm of breast: Secondary | ICD-10-CM | POA: Insufficient documentation

## 2016-03-18 ENCOUNTER — Other Ambulatory Visit: Payer: Self-pay | Admitting: Internal Medicine

## 2016-03-18 DIAGNOSIS — N6320 Unspecified lump in the left breast, unspecified quadrant: Secondary | ICD-10-CM

## 2016-03-22 ENCOUNTER — Ambulatory Visit
Admission: RE | Admit: 2016-03-22 | Discharge: 2016-03-22 | Disposition: A | Discharge status: Home / Self Care | Payer: BLUE CROSS/BLUE SHIELD | Source: Ambulatory Visit | Attending: Internal Medicine | Admitting: Internal Medicine

## 2016-03-22 DIAGNOSIS — N6320 Unspecified lump in the left breast, unspecified quadrant: Secondary | ICD-10-CM

## 2016-03-22 DIAGNOSIS — R922 Inconclusive mammogram: Secondary | ICD-10-CM | POA: Diagnosis not present

## 2016-03-23 DIAGNOSIS — R072 Precordial pain: Secondary | ICD-10-CM | POA: Diagnosis not present

## 2016-03-23 DIAGNOSIS — R0602 Shortness of breath: Secondary | ICD-10-CM | POA: Diagnosis not present

## 2016-03-23 DIAGNOSIS — Z01810 Encounter for preprocedural cardiovascular examination: Secondary | ICD-10-CM | POA: Diagnosis not present

## 2016-03-23 DIAGNOSIS — I421 Obstructive hypertrophic cardiomyopathy: Secondary | ICD-10-CM | POA: Diagnosis not present

## 2016-03-26 DIAGNOSIS — I5032 Chronic diastolic (congestive) heart failure: Secondary | ICD-10-CM | POA: Insufficient documentation

## 2016-03-26 HISTORY — DX: Chronic diastolic (congestive) heart failure: I50.32

## 2016-03-29 DIAGNOSIS — Z9889 Other specified postprocedural states: Secondary | ICD-10-CM | POA: Diagnosis not present

## 2016-03-29 DIAGNOSIS — D62 Acute posthemorrhagic anemia: Secondary | ICD-10-CM | POA: Diagnosis not present

## 2016-03-29 DIAGNOSIS — Z4682 Encounter for fitting and adjustment of non-vascular catheter: Secondary | ICD-10-CM | POA: Diagnosis not present

## 2016-03-29 DIAGNOSIS — I4891 Unspecified atrial fibrillation: Secondary | ICD-10-CM | POA: Diagnosis not present

## 2016-03-29 DIAGNOSIS — I349 Nonrheumatic mitral valve disorder, unspecified: Secondary | ICD-10-CM | POA: Diagnosis not present

## 2016-03-29 DIAGNOSIS — Z95810 Presence of automatic (implantable) cardiac defibrillator: Secondary | ICD-10-CM | POA: Diagnosis not present

## 2016-03-29 DIAGNOSIS — R011 Cardiac murmur, unspecified: Secondary | ICD-10-CM | POA: Diagnosis not present

## 2016-03-29 DIAGNOSIS — I9789 Other postprocedural complications and disorders of the circulatory system, not elsewhere classified: Secondary | ICD-10-CM | POA: Diagnosis not present

## 2016-03-29 DIAGNOSIS — R55 Syncope and collapse: Secondary | ICD-10-CM | POA: Diagnosis not present

## 2016-03-29 DIAGNOSIS — F329 Major depressive disorder, single episode, unspecified: Secondary | ICD-10-CM | POA: Diagnosis not present

## 2016-03-29 DIAGNOSIS — G8918 Other acute postprocedural pain: Secondary | ICD-10-CM | POA: Diagnosis not present

## 2016-03-29 DIAGNOSIS — I340 Nonrheumatic mitral (valve) insufficiency: Secondary | ICD-10-CM | POA: Diagnosis not present

## 2016-03-29 DIAGNOSIS — J90 Pleural effusion, not elsewhere classified: Secondary | ICD-10-CM | POA: Diagnosis not present

## 2016-03-29 DIAGNOSIS — R768 Other specified abnormal immunological findings in serum: Secondary | ICD-10-CM | POA: Diagnosis not present

## 2016-03-29 DIAGNOSIS — R918 Other nonspecific abnormal finding of lung field: Secondary | ICD-10-CM | POA: Diagnosis not present

## 2016-03-29 DIAGNOSIS — I5032 Chronic diastolic (congestive) heart failure: Secondary | ICD-10-CM | POA: Diagnosis not present

## 2016-03-29 DIAGNOSIS — J9600 Acute respiratory failure, unspecified whether with hypoxia or hypercapnia: Secondary | ICD-10-CM | POA: Diagnosis not present

## 2016-03-29 DIAGNOSIS — I110 Hypertensive heart disease with heart failure: Secondary | ICD-10-CM | POA: Diagnosis not present

## 2016-03-29 DIAGNOSIS — F419 Anxiety disorder, unspecified: Secondary | ICD-10-CM | POA: Diagnosis not present

## 2016-03-29 DIAGNOSIS — I421 Obstructive hypertrophic cardiomyopathy: Secondary | ICD-10-CM | POA: Diagnosis not present

## 2016-03-29 DIAGNOSIS — G8912 Acute post-thoracotomy pain: Secondary | ICD-10-CM | POA: Diagnosis not present

## 2016-03-29 DIAGNOSIS — Z452 Encounter for adjustment and management of vascular access device: Secondary | ICD-10-CM | POA: Diagnosis not present

## 2016-03-29 DIAGNOSIS — I209 Angina pectoris, unspecified: Secondary | ICD-10-CM | POA: Diagnosis not present

## 2016-03-29 DIAGNOSIS — J45909 Unspecified asthma, uncomplicated: Secondary | ICD-10-CM | POA: Diagnosis not present

## 2016-03-29 DIAGNOSIS — J9811 Atelectasis: Secondary | ICD-10-CM | POA: Diagnosis not present

## 2016-03-29 DIAGNOSIS — I480 Paroxysmal atrial fibrillation: Secondary | ICD-10-CM | POA: Diagnosis not present

## 2016-03-31 DIAGNOSIS — I340 Nonrheumatic mitral (valve) insufficiency: Secondary | ICD-10-CM | POA: Diagnosis not present

## 2016-03-31 DIAGNOSIS — Z9889 Other specified postprocedural states: Secondary | ICD-10-CM | POA: Diagnosis not present

## 2016-03-31 DIAGNOSIS — I421 Obstructive hypertrophic cardiomyopathy: Secondary | ICD-10-CM | POA: Diagnosis not present

## 2016-03-31 DIAGNOSIS — I5032 Chronic diastolic (congestive) heart failure: Secondary | ICD-10-CM | POA: Diagnosis not present

## 2016-03-31 DIAGNOSIS — R55 Syncope and collapse: Secondary | ICD-10-CM | POA: Diagnosis not present

## 2016-03-31 DIAGNOSIS — Z4682 Encounter for fitting and adjustment of non-vascular catheter: Secondary | ICD-10-CM | POA: Diagnosis not present

## 2016-03-31 HISTORY — DX: Other specified postprocedural states: Z98.890

## 2016-04-01 DIAGNOSIS — G8918 Other acute postprocedural pain: Secondary | ICD-10-CM | POA: Diagnosis not present

## 2016-04-01 DIAGNOSIS — Z95810 Presence of automatic (implantable) cardiac defibrillator: Secondary | ICD-10-CM | POA: Diagnosis not present

## 2016-04-01 DIAGNOSIS — I4891 Unspecified atrial fibrillation: Secondary | ICD-10-CM | POA: Diagnosis not present

## 2016-04-01 DIAGNOSIS — I5032 Chronic diastolic (congestive) heart failure: Secondary | ICD-10-CM | POA: Diagnosis not present

## 2016-04-01 DIAGNOSIS — R55 Syncope and collapse: Secondary | ICD-10-CM | POA: Diagnosis not present

## 2016-04-01 DIAGNOSIS — R918 Other nonspecific abnormal finding of lung field: Secondary | ICD-10-CM | POA: Diagnosis not present

## 2016-04-02 DIAGNOSIS — R768 Other specified abnormal immunological findings in serum: Secondary | ICD-10-CM | POA: Diagnosis not present

## 2016-04-02 DIAGNOSIS — I480 Paroxysmal atrial fibrillation: Secondary | ICD-10-CM | POA: Diagnosis not present

## 2016-04-02 DIAGNOSIS — I4891 Unspecified atrial fibrillation: Secondary | ICD-10-CM | POA: Diagnosis not present

## 2016-04-02 DIAGNOSIS — Z95810 Presence of automatic (implantable) cardiac defibrillator: Secondary | ICD-10-CM | POA: Diagnosis not present

## 2016-04-02 DIAGNOSIS — R55 Syncope and collapse: Secondary | ICD-10-CM | POA: Diagnosis not present

## 2016-04-02 DIAGNOSIS — Z9889 Other specified postprocedural states: Secondary | ICD-10-CM | POA: Diagnosis not present

## 2016-04-02 DIAGNOSIS — I9789 Other postprocedural complications and disorders of the circulatory system, not elsewhere classified: Secondary | ICD-10-CM | POA: Diagnosis not present

## 2016-04-02 DIAGNOSIS — R918 Other nonspecific abnormal finding of lung field: Secondary | ICD-10-CM | POA: Diagnosis not present

## 2016-04-02 DIAGNOSIS — G8918 Other acute postprocedural pain: Secondary | ICD-10-CM | POA: Diagnosis not present

## 2016-04-02 DIAGNOSIS — I421 Obstructive hypertrophic cardiomyopathy: Secondary | ICD-10-CM | POA: Diagnosis not present

## 2016-04-02 DIAGNOSIS — Z452 Encounter for adjustment and management of vascular access device: Secondary | ICD-10-CM | POA: Diagnosis not present

## 2016-04-03 DIAGNOSIS — I5032 Chronic diastolic (congestive) heart failure: Secondary | ICD-10-CM | POA: Diagnosis not present

## 2016-04-03 DIAGNOSIS — R55 Syncope and collapse: Secondary | ICD-10-CM | POA: Diagnosis not present

## 2016-04-03 DIAGNOSIS — J9811 Atelectasis: Secondary | ICD-10-CM | POA: Diagnosis not present

## 2016-04-03 DIAGNOSIS — J90 Pleural effusion, not elsewhere classified: Secondary | ICD-10-CM | POA: Diagnosis not present

## 2016-04-04 DIAGNOSIS — R55 Syncope and collapse: Secondary | ICD-10-CM | POA: Diagnosis not present

## 2016-04-04 DIAGNOSIS — R918 Other nonspecific abnormal finding of lung field: Secondary | ICD-10-CM | POA: Diagnosis not present

## 2016-04-04 DIAGNOSIS — I5032 Chronic diastolic (congestive) heart failure: Secondary | ICD-10-CM | POA: Diagnosis not present

## 2016-04-05 DIAGNOSIS — R918 Other nonspecific abnormal finding of lung field: Secondary | ICD-10-CM | POA: Diagnosis not present

## 2016-04-05 DIAGNOSIS — R55 Syncope and collapse: Secondary | ICD-10-CM | POA: Diagnosis not present

## 2016-04-05 DIAGNOSIS — I5032 Chronic diastolic (congestive) heart failure: Secondary | ICD-10-CM | POA: Diagnosis not present

## 2016-04-05 DIAGNOSIS — J90 Pleural effusion, not elsewhere classified: Secondary | ICD-10-CM | POA: Diagnosis not present

## 2016-04-05 DIAGNOSIS — I421 Obstructive hypertrophic cardiomyopathy: Secondary | ICD-10-CM | POA: Diagnosis not present

## 2016-04-06 DIAGNOSIS — R55 Syncope and collapse: Secondary | ICD-10-CM | POA: Diagnosis not present

## 2016-04-06 DIAGNOSIS — J90 Pleural effusion, not elsewhere classified: Secondary | ICD-10-CM | POA: Diagnosis not present

## 2016-04-06 DIAGNOSIS — I5032 Chronic diastolic (congestive) heart failure: Secondary | ICD-10-CM | POA: Diagnosis not present

## 2016-04-06 DIAGNOSIS — J9811 Atelectasis: Secondary | ICD-10-CM | POA: Diagnosis not present

## 2016-04-07 DIAGNOSIS — R55 Syncope and collapse: Secondary | ICD-10-CM | POA: Diagnosis not present

## 2016-04-08 ENCOUNTER — Encounter: Payer: Self-pay | Admitting: Internal Medicine

## 2016-04-08 ENCOUNTER — Ambulatory Visit (INDEPENDENT_AMBULATORY_CARE_PROVIDER_SITE_OTHER): Payer: BLUE CROSS/BLUE SHIELD | Admitting: Internal Medicine

## 2016-04-08 ENCOUNTER — Inpatient Hospital Stay: Payer: BLUE CROSS/BLUE SHIELD | Admitting: Internal Medicine

## 2016-04-08 VITALS — BP 118/60 | HR 78 | Wt 159.0 lb

## 2016-04-08 DIAGNOSIS — Z9889 Other specified postprocedural states: Secondary | ICD-10-CM

## 2016-04-08 DIAGNOSIS — I421 Obstructive hypertrophic cardiomyopathy: Secondary | ICD-10-CM | POA: Diagnosis not present

## 2016-04-08 DIAGNOSIS — I480 Paroxysmal atrial fibrillation: Secondary | ICD-10-CM

## 2016-04-08 DIAGNOSIS — Z792 Long term (current) use of antibiotics: Secondary | ICD-10-CM | POA: Diagnosis not present

## 2016-04-08 DIAGNOSIS — Z7901 Long term (current) use of anticoagulants: Secondary | ICD-10-CM | POA: Diagnosis not present

## 2016-04-08 MED ORDER — OXYCODONE HCL 5 MG PO CAPS: 10.0000 mg | ORAL_CAPSULE | ORAL | 0 refills | Status: DC | PRN

## 2016-04-08 MED ORDER — ONDANSETRON 4 MG PO TBDP: 4.0000 mg | ORAL_TABLET | Freq: Three times a day (TID) | ORAL | 0 refills | Status: DC | PRN

## 2016-04-08 NOTE — Progress Notes (Signed)
Date:  04/08/2016   Name:  Carmen Graves   DOB:  1956-04-09   MRN:  [id number]   Chief Complaint: Hospitalization Follow-up Patient is here for followup from surgery at [facility].  She underwent ventriculo-myomectomy and mitral valve revision for HOCM on 03/31/16.  She was discharged home yesterday. She has oxycodone for pain but was not given the correct strength of 10 mg.  She will run out of meds in one week and her surgery follow up is in 3 weeks. She was placed on warfarin instead of Eliquis for the immediate post op period and needs INR checked - goal is 2.5 - 3.5. She doing well so far.  She has significant fluid retention and has started on lasix 40 mg per day.  Many of her medications have changed. For neuropathic pain she was placed on gabapentin in stead of non steroidals.  She is taking tylenol. She is having some nausea and needs refill on Zofran.    Review of Systems  Constitutional: Positive for fatigue. Negative for chills and fever.  Respiratory: Negative for cough, chest tightness, shortness of breath and wheezing.   Cardiovascular: Positive for chest pain and leg swelling. Negative for palpitations.  Gastrointestinal: Positive for nausea. Negative for abdominal distention, abdominal pain, diarrhea and vomiting.  Neurological: Negative for dizziness and headaches.  Psychiatric/Behavioral: Negative for confusion.    Patient Active Problem List   Diagnosis Date Noted  . CHF (congestive heart failure), NYHA class III, chronic, diastolic ([place]) 99991111  . Paroxysmal atrial fibrillation ([place]) 06/20/2015  . Abnormal genetic test 05/08/2015  . Presence of automatic cardioverter/defibrillator (AICD) 03/13/2015  . Anxiety and depression 03/07/2015  . Hyperlipidemia, mild 03/05/2015  . Osteoporosis 03/05/2015  . History of palpitations 03/05/2015  . Hypertrophic obstructive cardiomyopathy ([place]) 02/27/2015  . Neurocardiogenic syncope 02/23/2015    Prior to Admission  medications   Medication Sig Start Date End Date Taking? Authorizing Provider  acetaminophen (TYLENOL) 650 MG CR tablet Take by mouth. 04/07/16  Yes Historical Provider, MD  amiodarone (PACERONE) 200 MG tablet Take 200 mg by mouth daily.   Yes Historical Provider, MD  aspirin 81 MG chewable tablet Chew by mouth daily.   Yes Historical Provider, MD  diltiazem (CARDIZEM CD) 360 MG 24 hr capsule Take by mouth. 04/08/16  Yes Historical Provider, MD  diltiazem (TIAZAC) 360 MG 24 hr capsule Take 360 mg by mouth daily.   Yes Historical Provider, MD  furosemide (LASIX) 40 MG tablet Take 40 mg by mouth.   Yes Historical Provider, MD  gabapentin (NEURONTIN) 100 MG capsule Take 100 mg by mouth 3 (three) times daily.   Yes Historical Provider, MD  ondansetron (ZOFRAN) 4 MG tablet Take 4 mg by mouth every 8 (eight) hours as needed for nausea or vomiting.   Yes Historical Provider, MD  pantoprazole (PROTONIX) 40 MG tablet Take 40 mg by mouth daily.   Yes Historical Provider, MD  potassium chloride SA (K-DUR,KLOR-CON) 20 MEQ tablet Take 20 mEq by mouth 2 (two) times daily.   Yes Historical Provider, MD  sertraline (ZOLOFT) 25 MG tablet TAKE 1 TABLET (25 MG TOTAL) BY MOUTH DAILY. 10/24/15  Yes Glean Hess, MD  warfarin (COUMADIN) 2.5 MG tablet Take 2.5 mg by mouth daily.   Yes Historical Provider, MD  ondansetron (ZOFRAN ODT) 4 MG disintegrating tablet Take 1 tablet (4 mg total) by mouth every 8 (eight) hours as needed for nausea or vomiting. 04/08/16   Glean Hess, MD  oxycodone (OXY-IR) 5 MG capsule Take 2 capsules (10 mg total) by mouth every 4 (four) hours as needed. 04/08/16   Glean Hess, MD    Allergies  Allergen Reactions  . Albuterol Shortness Of Breath    Chest pain and SOB  . Budesonide-Formoterol Fumarate Shortness Of Breath    Chest pain, severe SOB   . Fluticasone-Salmeterol Shortness Of Breath  . Ipratropium-Albuterol Shortness Of Breath    Chest pain  . Sulfa Antibiotics Other  (See Comments)    Causes kidney disease  . Codeine Nausea Only  . Prednisone Other (See Comments)    Chest pain and tightness    Past Surgical History:  Procedure Laterality Date  . BONY PELVIS SURGERY     tore muscle from pelvic bone  . BREAST BIOPSY Bilateral 15+ YRS AGO   NEG  . BREAST CYST ASPIRATION Bilateral   . CARDIAC DEFIBRILLATOR PLACEMENT  01/2015   for HOCM  . TONSILLECTOMY AND ADENOIDECTOMY    . UMBILICAL HERNIA REPAIR      Social History  Substance Use Topics  . Smoking status: Never Smoker  . Smokeless tobacco: Never Used  . Alcohol use No     Medication list has been reviewed and updated.   Physical Exam  Constitutional: She is oriented to person, place, and time. She appears well-developed. No distress.  HENT:  Head: Normocephalic and atraumatic.  Cardiovascular: Normal rate, regular rhythm and normal heart sounds.   Pulmonary/Chest: Effort normal and breath sounds normal. No respiratory distress.  Median sternotomy intact with staples - incision clean and dry with minimal bruising.  Musculoskeletal: She exhibits edema.  Neurological: She is alert and oriented to person, place, and time.  Skin: Skin is warm and dry. No rash noted.  Psychiatric: She has a normal mood and affect. Her behavior is normal. Thought content normal.  Nursing note and vitals reviewed.   BP 118/60   Pulse 78   Wt 159 lb (72.1 kg)   BMI 27.29 kg/m   Assessment and Plan: 1. Status post cardiac surgery Will give Rx for pain medication since discharge Rx was insufficient - oxycodone (OXY-IR) 5 MG capsule; Take 2 capsules (10 mg total) by mouth every 4 (four) hours as needed.  Dispense: 240 capsule; Refill: 0  2. On warfarin therapy Check INR today and again in 10 days along with CMP and CBC - Protime-INR  3. Paroxysmal atrial fibrillation (HCC) In SR now - continue diltiazem  4. Hypertrophic obstructive cardiomyopathy ([place]) S/p surgical intervention  5. Need for  prophylactic antibiotic Will need this in future for dental cleanings etc   Halina Maidens, MD [facility]  04/08/2016

## 2016-04-08 NOTE — Progress Notes (Deleted)
Date:  04/08/2016   Name:  Carmen Graves   DOB:  Jul 07, 1955   MRN:  [id number]   Chief Complaint: Hospitalization Follow-up HPI Hospitalized for cardiac surgery for HOCM.  She underwent ventriculo-myomectomy and Mitral valve revision.  Pacemaker placement remains.   Review of Systems  Patient Active Problem List   Diagnosis Date Noted  . Paroxysmal atrial fibrillation ([place]) 06/20/2015  . Abnormal genetic test 05/08/2015  . Presence of automatic cardioverter/defibrillator (AICD) 03/13/2015  . Anxiety and depression 03/07/2015  . Hyperlipidemia, mild 03/05/2015  . Osteoporosis 03/05/2015  . History of palpitations 03/05/2015  . Hypertrophic obstructive cardiomyopathy ([place]) 02/27/2015  . Neurocardiogenic syncope 02/23/2015    Prior to Admission medications   Medication Sig Start Date End Date Taking? Authorizing Provider  acetaminophen (TYLENOL) 650 MG CR tablet Take by mouth. 04/07/16  Yes Historical Provider, MD  amiodarone (PACERONE) 200 MG tablet Take 200 mg by mouth daily.   Yes Historical Provider, MD  aspirin 81 MG chewable tablet Chew by mouth daily.   Yes Historical Provider, MD  diltiazem (CARDIZEM CD) 360 MG 24 hr capsule Take by mouth. 04/08/16  Yes Historical Provider, MD  diltiazem (TIAZAC) 360 MG 24 hr capsule Take 360 mg by mouth daily.   Yes Historical Provider, MD  furosemide (LASIX) 40 MG tablet Take 40 mg by mouth.   Yes Historical Provider, MD  gabapentin (NEURONTIN) 100 MG capsule Take 100 mg by mouth 3 (three) times daily.   Yes Historical Provider, MD  ondansetron (ZOFRAN) 4 MG tablet Take 4 mg by mouth every 8 (eight) hours as needed for nausea or vomiting.   Yes Historical Provider, MD  oxycodone (OXY-IR) 5 MG capsule Take 10 mg by mouth every 4 (four) hours as needed.   Yes Historical Provider, MD  pantoprazole (PROTONIX) 40 MG tablet Take 40 mg by mouth daily.   Yes Historical Provider, MD  potassium chloride SA (K-DUR,KLOR-CON) 20 MEQ tablet Take 20 mEq  by mouth 2 (two) times daily.   Yes Historical Provider, MD  sertraline (ZOLOFT) 25 MG tablet TAKE 1 TABLET (25 MG TOTAL) BY MOUTH DAILY. 10/24/15  Yes Glean Hess, MD  warfarin (COUMADIN) 2.5 MG tablet Take 2.5 mg by mouth daily.   Yes Historical Provider, MD  apixaban (ELIQUIS) 5 MG TABS tablet Take 1 tablet by mouth 2 (two) times daily. 05/02/15   Historical Provider, MD  Bilberry, Vaccinium myrtillus, 1000 MG CAPS Take 1 capsule by mouth daily.    Historical Provider, MD  BOSWELLIA SERRATA PO Take 1 capsule by mouth.    Historical Provider, MD  Collagen 500 MG CAPS Take by mouth.    Historical Provider, MD  cycloSPORINE (RESTASIS) 0.05 % ophthalmic emulsion 1 drop 2 (two) times daily.    Historical Provider, MD  disopyramide (NORPACE) 100 MG capsule Take 1 capsule by mouth 3 (three) times daily. 05/02/15 05/01/16  Historical Provider, MD  Ergocalciferol (VITAMIN D2) 2000 UNITS TABS Take by mouth.    Historical Provider, MD  Flavocoxid-Cit Zn Bisglcinate (LIMBREL500) 500-50 MG CAPS 2 cap po QAM, then 1 po HS. 01/21/15   Historical Provider, MD  Glucosamine HCl 1000 MG TABS Take 1 tablet by mouth daily.    Historical Provider, MD  Magnesium 250 MG TABS Take by mouth.    Historical Provider, MD  METOPROLOL SUCCINATE ER PO Take 50 mg by mouth 2 (two) times daily. 1 tab in the morning and 1 tab in the evening.  Historical Provider, MD  [facility] Extract 95 % POWD by Does not apply route.    Historical Provider, MD  Resveratrol 100 MG CAPS Take 1 capsule by mouth daily.    Historical Provider, MD  S-Adenosylmethionine (SAM-E) 400 MG TABS Take by mouth.    Historical Provider, MD    Allergies  Allergen Reactions  . Albuterol Shortness Of Breath    Chest pain and SOB  . Budesonide-Formoterol Fumarate Shortness Of Breath    Chest pain, severe SOB   . Fluticasone-Salmeterol Shortness Of Breath  . Ipratropium-Albuterol Shortness Of Breath    Chest pain  . Sulfa Antibiotics Other (See Comments)     Causes kidney disease  . Codeine Nausea Only  . Prednisone Other (See Comments)    Chest pain and tightness    Past Surgical History:  Procedure Laterality Date  . BONY PELVIS SURGERY     tore muscle from pelvic bone  . BREAST BIOPSY Bilateral 15+ YRS AGO   NEG  . BREAST CYST ASPIRATION Bilateral   . CARDIAC DEFIBRILLATOR PLACEMENT  01/2015   for HOCM  . TONSILLECTOMY AND ADENOIDECTOMY    . UMBILICAL HERNIA REPAIR      Social History  Substance Use Topics  . Smoking status: Never Smoker  . Smokeless tobacco: Never Used  . Alcohol use No     Medication list has been reviewed and updated.   Physical Exam  BP 118/60   Pulse 78   Wt 159 lb (72.1 kg)   BMI 27.29 kg/m   Assessment and Plan:

## 2016-04-09 LAB — PROTIME-INR
INR: 2.4 — AB (ref 0.8–1.2)
Prothrombin Time: 23.6 s — ABNORMAL HIGH (ref 9.1–12.0)

## 2016-04-13 DIAGNOSIS — I5033 Acute on chronic diastolic (congestive) heart failure: Secondary | ICD-10-CM | POA: Diagnosis not present

## 2016-04-13 DIAGNOSIS — T40605A Adverse effect of unspecified narcotics, initial encounter: Secondary | ICD-10-CM | POA: Diagnosis not present

## 2016-04-13 DIAGNOSIS — F329 Major depressive disorder, single episode, unspecified: Secondary | ICD-10-CM | POA: Diagnosis not present

## 2016-04-13 DIAGNOSIS — R000 Tachycardia, unspecified: Secondary | ICD-10-CM | POA: Diagnosis not present

## 2016-04-13 DIAGNOSIS — Z7982 Long term (current) use of aspirin: Secondary | ICD-10-CM | POA: Diagnosis not present

## 2016-04-13 DIAGNOSIS — Z7901 Long term (current) use of anticoagulants: Secondary | ICD-10-CM | POA: Diagnosis not present

## 2016-04-13 DIAGNOSIS — I421 Obstructive hypertrophic cardiomyopathy: Secondary | ICD-10-CM | POA: Diagnosis not present

## 2016-04-13 DIAGNOSIS — I480 Paroxysmal atrial fibrillation: Secondary | ICD-10-CM | POA: Diagnosis not present

## 2016-04-13 DIAGNOSIS — K5903 Drug induced constipation: Secondary | ICD-10-CM | POA: Diagnosis not present

## 2016-04-13 DIAGNOSIS — Z5181 Encounter for therapeutic drug level monitoring: Secondary | ICD-10-CM | POA: Diagnosis not present

## 2016-04-13 DIAGNOSIS — M1990 Unspecified osteoarthritis, unspecified site: Secondary | ICD-10-CM | POA: Diagnosis not present

## 2016-04-13 DIAGNOSIS — Z79899 Other long term (current) drug therapy: Secondary | ICD-10-CM | POA: Diagnosis not present

## 2016-04-13 DIAGNOSIS — J45909 Unspecified asthma, uncomplicated: Secondary | ICD-10-CM | POA: Diagnosis not present

## 2016-04-13 DIAGNOSIS — I447 Left bundle-branch block, unspecified: Secondary | ICD-10-CM | POA: Diagnosis not present

## 2016-04-13 DIAGNOSIS — I340 Nonrheumatic mitral (valve) insufficiency: Secondary | ICD-10-CM | POA: Diagnosis not present

## 2016-04-13 DIAGNOSIS — I509 Heart failure, unspecified: Secondary | ICD-10-CM | POA: Diagnosis not present

## 2016-04-13 DIAGNOSIS — J90 Pleural effusion, not elsewhere classified: Secondary | ICD-10-CM | POA: Diagnosis not present

## 2016-04-13 DIAGNOSIS — D649 Anemia, unspecified: Secondary | ICD-10-CM | POA: Diagnosis not present

## 2016-04-13 DIAGNOSIS — R0602 Shortness of breath: Secondary | ICD-10-CM | POA: Diagnosis not present

## 2016-04-13 DIAGNOSIS — R002 Palpitations: Secondary | ICD-10-CM | POA: Diagnosis not present

## 2016-04-13 DIAGNOSIS — G629 Polyneuropathy, unspecified: Secondary | ICD-10-CM | POA: Diagnosis not present

## 2016-04-13 DIAGNOSIS — F419 Anxiety disorder, unspecified: Secondary | ICD-10-CM | POA: Diagnosis not present

## 2016-04-13 DIAGNOSIS — Z952 Presence of prosthetic heart valve: Secondary | ICD-10-CM | POA: Diagnosis not present

## 2016-04-13 DIAGNOSIS — M8580 Other specified disorders of bone density and structure, unspecified site: Secondary | ICD-10-CM | POA: Diagnosis not present

## 2016-04-13 DIAGNOSIS — R918 Other nonspecific abnormal finding of lung field: Secondary | ICD-10-CM | POA: Diagnosis not present

## 2016-04-14 DIAGNOSIS — I340 Nonrheumatic mitral (valve) insufficiency: Secondary | ICD-10-CM | POA: Diagnosis not present

## 2016-04-14 DIAGNOSIS — I421 Obstructive hypertrophic cardiomyopathy: Secondary | ICD-10-CM | POA: Diagnosis not present

## 2016-04-14 DIAGNOSIS — R0602 Shortness of breath: Secondary | ICD-10-CM | POA: Diagnosis not present

## 2016-04-14 DIAGNOSIS — I480 Paroxysmal atrial fibrillation: Secondary | ICD-10-CM | POA: Diagnosis not present

## 2016-04-15 DIAGNOSIS — R0602 Shortness of breath: Secondary | ICD-10-CM | POA: Diagnosis not present

## 2016-04-15 DIAGNOSIS — I480 Paroxysmal atrial fibrillation: Secondary | ICD-10-CM | POA: Diagnosis not present

## 2016-04-15 DIAGNOSIS — I340 Nonrheumatic mitral (valve) insufficiency: Secondary | ICD-10-CM | POA: Diagnosis not present

## 2016-04-15 DIAGNOSIS — I421 Obstructive hypertrophic cardiomyopathy: Secondary | ICD-10-CM | POA: Diagnosis not present

## 2016-04-16 ENCOUNTER — Other Ambulatory Visit: Payer: Self-pay | Admitting: Internal Medicine

## 2016-04-16 DIAGNOSIS — I340 Nonrheumatic mitral (valve) insufficiency: Secondary | ICD-10-CM | POA: Diagnosis not present

## 2016-04-16 DIAGNOSIS — I480 Paroxysmal atrial fibrillation: Secondary | ICD-10-CM | POA: Diagnosis not present

## 2016-04-16 DIAGNOSIS — I421 Obstructive hypertrophic cardiomyopathy: Secondary | ICD-10-CM | POA: Diagnosis not present

## 2016-04-16 DIAGNOSIS — R0602 Shortness of breath: Secondary | ICD-10-CM | POA: Diagnosis not present

## 2016-04-19 ENCOUNTER — Ambulatory Visit: Payer: BLUE CROSS/BLUE SHIELD | Admitting: Internal Medicine

## 2016-04-20 ENCOUNTER — Encounter: Payer: Self-pay | Admitting: Internal Medicine

## 2016-04-20 ENCOUNTER — Other Ambulatory Visit: Payer: Self-pay | Admitting: Internal Medicine

## 2016-04-20 ENCOUNTER — Ambulatory Visit (INDEPENDENT_AMBULATORY_CARE_PROVIDER_SITE_OTHER): Payer: BLUE CROSS/BLUE SHIELD | Admitting: Internal Medicine

## 2016-04-20 VITALS — BP 107/66 | HR 70 | Wt 144.0 lb

## 2016-04-20 DIAGNOSIS — D62 Acute posthemorrhagic anemia: Secondary | ICD-10-CM | POA: Diagnosis not present

## 2016-04-20 DIAGNOSIS — I421 Obstructive hypertrophic cardiomyopathy: Secondary | ICD-10-CM | POA: Diagnosis not present

## 2016-04-20 DIAGNOSIS — I808 Phlebitis and thrombophlebitis of other sites: Secondary | ICD-10-CM

## 2016-04-20 DIAGNOSIS — I5032 Chronic diastolic (congestive) heart failure: Secondary | ICD-10-CM

## 2016-04-20 DIAGNOSIS — Z7901 Long term (current) use of anticoagulants: Secondary | ICD-10-CM

## 2016-04-20 DIAGNOSIS — I480 Paroxysmal atrial fibrillation: Secondary | ICD-10-CM

## 2016-04-20 DIAGNOSIS — E876 Hypokalemia: Secondary | ICD-10-CM | POA: Diagnosis not present

## 2016-04-20 NOTE — Progress Notes (Signed)
Date:  04/20/2016   Name:  Carmen Graves   DOB:  [birth year]   MRN:  [id number]   Chief Complaint: Hospitalization Follow-up (feeling better and have not been in AFIB since yesterday morning. Slept through the night for the first time last night. Wants labs.) Admitted to [facility] with SOB and fluid overload from 04/13/16 through 04/16/16.  Medications were adjusted to control Afib and she was diuresed.  Lasix was stopped and Torsemide started. She needs to have INR checked before the holiday weekend. She was also anemic - felt to be due to post op anemia.  Iron IV was administered and she was instructed to take oral iron and folate. Her neuropathic pain from her legs and incision were treated with ongoing gabapentin and vitamin B6 was added. She has now been 24 hours without Afib and is feeling better.  She has had some muscle cramps in her abdomen and had to get potassium IV while in the hospital.  Review of Systems  Constitutional: Positive for fatigue. Negative for chills, diaphoresis and fever.  Respiratory: Positive for shortness of breath. Negative for cough, chest tightness and wheezing.   Cardiovascular: Positive for chest pain (from surgery) and leg swelling. Negative for palpitations.  Gastrointestinal: Positive for constipation. Negative for abdominal pain and blood in stool.  Genitourinary: Negative for dysuria and hematuria.  Musculoskeletal: Positive for myalgias. Negative for arthralgias.  Skin: Negative for rash and wound.  Neurological: Negative for dizziness and headaches.  Psychiatric/Behavioral: Positive for hallucinations (on percocet so is only taking it at night) and sleep disturbance. Negative for dysphoric mood.    Patient Active Problem List   Diagnosis Date Noted  . Postoperative anemia due to acute blood loss 04/20/2016  . Need for prophylactic antibiotic 04/08/2016  . On warfarin therapy 04/08/2016  . Status post cardiac surgery 04/08/2016  . CHF (congestive  heart failure), NYHA class III, chronic, diastolic ([place]) [id number]  . Paroxysmal atrial fibrillation ([place]) 06/20/2015  . Abnormal genetic test 05/08/2015  . Presence of automatic cardioverter/defibrillator (AICD) 03/13/2015  . Anxiety and depression 03/07/2015  . Hyperlipidemia, mild 03/05/2015  . Osteoporosis 03/05/2015  . History of palpitations 03/05/2015  . Hypertrophic obstructive cardiomyopathy ([place]) 02/27/2015  . Neurocardiogenic syncope 02/23/2015    Prior to Admission medications   Medication Sig Start Date End Date Taking? Authorizing Provider  acetaminophen (TYLENOL) 650 MG CR tablet Take by mouth. 04/07/16  Yes Historical Provider, MD  amiodarone (PACERONE) 200 MG tablet Take 200 mg by mouth daily.   Yes Historical Provider, MD  aspirin 81 MG chewable tablet Chew by mouth daily.   Yes Historical Provider, MD  diltiazem (TIAZAC) 360 MG 24 hr capsule Take 360 mg by mouth daily.   Yes Historical Provider, MD  folic acid (FOLVITE) 1 MG tablet Take 1 tablet by mouth daily. 04/17/16 04/17/17 Yes Historical Provider, MD  gabapentin (NEURONTIN) 100 MG capsule Take 100 mg by mouth 3 (three) times daily.   Yes Historical Provider, MD  metoprolol succinate (TOPROL-XL) 25 MG 24 hr tablet Take 1 tablet by mouth 2 (two) times daily.  04/16/16 04/16/17 Yes Historical Provider, MD  oxycodone (OXY-IR) 5 MG capsule Take 2 capsules (10 mg total) by mouth every 4 (four) hours as needed. 04/08/16  Yes Glean Hess, MD  pantoprazole (PROTONIX) 40 MG tablet Take 40 mg by mouth daily.   Yes Historical Provider, MD  pyridoxine (B-6) 100 MG tablet Take 1 tablet by mouth daily. 04/17/16 04/17/17 Yes Historical  Provider, MD  sertraline (ZOLOFT) 25 MG tablet TAKE 1 TABLET (25 MG TOTAL) BY MOUTH DAILY. 04/16/16  Yes Glean Hess, MD  torsemide (DEMADEX) 20 MG tablet Take 1 tablet by mouth daily. 04/16/16  Yes Historical Provider, MD  vitamin C (ASCORBIC ACID) 500 MG tablet Take 500 mg by mouth daily.    Yes Historical Provider, MD  warfarin (COUMADIN) 2.5 MG tablet Take 2 mg by mouth daily.    Yes Historical Provider, MD  FERROCITE 324 MG TABS tablet Take 1 tablet by mouth 2 (two) times daily. 04/07/16 04/28/16  Historical Provider, MD  ondansetron (ZOFRAN ODT) 4 MG disintegrating tablet Take 1 tablet (4 mg total) by mouth every 8 (eight) hours as needed for nausea or vomiting. Patient not taking: Reported on 04/20/2016 04/08/16   Glean Hess, MD  potassium chloride SA (K-DUR,KLOR-CON) 20 MEQ tablet Take 20 mEq by mouth 2 (two) times daily.    Historical Provider, MD  sodium chloride (OCEAN) 0.65 % nasal spray Place into the nose. 04/16/16   Historical Provider, MD    Allergies  Allergen Reactions  . Albuterol Shortness Of Breath    Chest pain and SOB  . Budesonide-Formoterol Fumarate Shortness Of Breath    Chest pain, severe SOB   . Fluticasone-Salmeterol Shortness Of Breath  . Ipratropium-Albuterol Shortness Of Breath    Chest pain  . Sulfa Antibiotics Other (See Comments)    Causes kidney disease  . Codeine Nausea Only  . Prednisone Other (See Comments)    Chest pain and tightness    Past Surgical History:  Procedure Laterality Date  . BONY PELVIS SURGERY     tore muscle from pelvic bone  . BREAST BIOPSY Bilateral 15+ YRS AGO   NEG  . BREAST CYST ASPIRATION Bilateral   . CARDIAC DEFIBRILLATOR PLACEMENT  01/2015   for HOCM  . TONSILLECTOMY AND ADENOIDECTOMY    . UMBILICAL HERNIA REPAIR      Social History  Substance Use Topics  . Smoking status: Never Smoker  . Smokeless tobacco: Never Used  . Alcohol use No     Medication list has been reviewed and updated.   Physical Exam  Constitutional: She is oriented to person, place, and time. She appears well-developed. No distress.  HENT:  Head: Normocephalic and atraumatic.  Right Ear: Ear canal normal. Tympanic membrane is retracted. Tympanic membrane is not erythematous.  Left Ear: Ear canal normal. Tympanic  membrane is retracted. Tympanic membrane is not erythematous.  Neck: Normal range of motion. Neck supple. No thyromegaly present.  Pulmonary/Chest: Effort normal and breath sounds normal. No respiratory distress. She has no wheezes.  Median sternotomy staples intact, incision clean and dry  Abdominal: Soft. Bowel sounds are normal.  Musculoskeletal: Normal range of motion. She exhibits edema (1+ in feet and ankles).  Lymphadenopathy:    She has no cervical adenopathy.  Neurological: She is alert and oriented to person, place, and time.  Skin: Skin is warm and dry. No rash noted.     Psychiatric: She has a normal mood and affect. Her speech is normal and behavior is normal. Thought content normal.  Nursing note and vitals reviewed.   BP 107/66   Pulse 70   Wt 144 lb (65.3 kg)   BMI 24.72 kg/m   Assessment and Plan: 1. CHF (congestive heart failure), NYHA class III, chronic, diastolic (HCC) improved  2. Hypertrophic obstructive cardiomyopathy ([place]) S/p surgical treatment  3. Paroxysmal atrial fibrillation (HCC) Better control on current  medications Continue warfarin - Protime-INR  4. Postoperative anemia due to acute blood loss Continue iron rich foods - CBC with Differential/Platelet  5. On warfarin therapy - Protime-INR  6. Hypokalemia Discussed high potassium foods - Basic metabolic panel  7. Thrombophlebitis arm Warm compresses as needed   Halina Maidens, MD [facility]  04/20/2016

## 2016-04-20 NOTE — Patient Instructions (Signed)
Increase intake of iron rich foods  Take 4-5 deep breaths every hour  Continue to elevate feet as able

## 2016-04-21 ENCOUNTER — Other Ambulatory Visit: Payer: Self-pay | Admitting: Internal Medicine

## 2016-04-21 ENCOUNTER — Other Ambulatory Visit: Payer: Self-pay

## 2016-04-21 ENCOUNTER — Ambulatory Visit: Payer: BLUE CROSS/BLUE SHIELD | Admitting: Internal Medicine

## 2016-04-21 DIAGNOSIS — Z7901 Long term (current) use of anticoagulants: Secondary | ICD-10-CM | POA: Diagnosis not present

## 2016-04-21 DIAGNOSIS — I480 Paroxysmal atrial fibrillation: Secondary | ICD-10-CM | POA: Diagnosis not present

## 2016-04-21 LAB — CBC WITH DIFFERENTIAL/PLATELET
BASOS ABS: 0.1 10*3/uL (ref 0.0–0.2)
BASOS: 1 %
EOS (ABSOLUTE): 0.1 10*3/uL (ref 0.0–0.4)
Eos: 1 %
Hematocrit: 32.8 % — ABNORMAL LOW (ref 34.0–46.6)
Hemoglobin: 10.6 g/dL — ABNORMAL LOW (ref 11.1–15.9)
IMMATURE GRANULOCYTES: 0 %
Immature Grans (Abs): 0 10*3/uL (ref 0.0–0.1)
LYMPHS: 14 %
Lymphocytes Absolute: 0.8 10*3/uL (ref 0.7–3.1)
MCH: 29.9 pg (ref 26.6–33.0)
MCHC: 32.3 g/dL (ref 31.5–35.7)
MCV: 93 fL (ref 79–97)
Monocytes Absolute: 0.5 10*3/uL (ref 0.1–0.9)
Monocytes: 9 %
NEUTROS PCT: 75 %
Neutrophils Absolute: 4.2 10*3/uL (ref 1.4–7.0)
PLATELETS: 467 10*3/uL — AB (ref 150–379)
RBC: 3.54 x10E6/uL — AB (ref 3.77–5.28)
RDW: 15.8 % — AB (ref 12.3–15.4)
WBC: 5.6 10*3/uL (ref 3.4–10.8)

## 2016-04-21 LAB — PROTIME-INR

## 2016-04-21 LAB — BASIC METABOLIC PANEL
BUN / CREAT RATIO: 15 (ref 12–28)
BUN: 12 mg/dL (ref 8–27)
CO2: 23 mmol/L (ref 18–29)
Calcium: 9.1 mg/dL (ref 8.7–10.3)
Chloride: 102 mmol/L (ref 96–106)
Creatinine, Ser: 0.81 mg/dL (ref 0.57–1.00)
GFR calc Af Amer: 90 mL/min/{1.73_m2} (ref >59)
GFR calc non Af Amer: 78 mL/min/{1.73_m2} (ref >59)
GLUCOSE: 100 mg/dL — AB (ref 65–99)
POTASSIUM: 4.4 mmol/L (ref 3.5–5.2)
SODIUM: 143 mmol/L (ref 134–144)

## 2016-04-21 MED ORDER — GABAPENTIN 100 MG PO CAPS: 100.0000 mg | ORAL_CAPSULE | Freq: Three times a day (TID) | ORAL | 2 refills | Status: DC

## 2016-04-22 LAB — PROTIME-INR
INR: 1.3 — ABNORMAL HIGH (ref 0.8–1.2)
Prothrombin Time: 13.3 s — ABNORMAL HIGH (ref 9.1–12.0)

## 2016-04-27 ENCOUNTER — Other Ambulatory Visit: Payer: Self-pay

## 2016-04-27 DIAGNOSIS — Z7901 Long term (current) use of anticoagulants: Secondary | ICD-10-CM | POA: Diagnosis not present

## 2016-04-27 DIAGNOSIS — I480 Paroxysmal atrial fibrillation: Secondary | ICD-10-CM | POA: Diagnosis not present

## 2016-04-28 LAB — PROTIME-INR
INR: 1.9 — AB (ref 0.8–1.2)
PROTHROMBIN TIME: 19 s — AB (ref 9.1–12.0)

## 2016-04-29 DIAGNOSIS — I517 Cardiomegaly: Secondary | ICD-10-CM | POA: Diagnosis not present

## 2016-04-29 DIAGNOSIS — S32592A Other specified fracture of left pubis, initial encounter for closed fracture: Secondary | ICD-10-CM | POA: Diagnosis not present

## 2016-04-29 DIAGNOSIS — I447 Left bundle-branch block, unspecified: Secondary | ICD-10-CM | POA: Diagnosis not present

## 2016-04-29 DIAGNOSIS — R9431 Abnormal electrocardiogram [ECG] [EKG]: Secondary | ICD-10-CM | POA: Diagnosis not present

## 2016-04-29 DIAGNOSIS — R918 Other nonspecific abnormal finding of lung field: Secondary | ICD-10-CM | POA: Diagnosis not present

## 2016-04-29 DIAGNOSIS — Z48812 Encounter for surgical aftercare following surgery on the circulatory system: Secondary | ICD-10-CM | POA: Diagnosis not present

## 2016-04-29 DIAGNOSIS — Z9889 Other specified postprocedural states: Secondary | ICD-10-CM | POA: Diagnosis not present

## 2016-04-29 DIAGNOSIS — J90 Pleural effusion, not elsewhere classified: Secondary | ICD-10-CM | POA: Diagnosis not present

## 2016-04-29 DIAGNOSIS — I480 Paroxysmal atrial fibrillation: Secondary | ICD-10-CM | POA: Diagnosis not present

## 2016-04-30 ENCOUNTER — Other Ambulatory Visit: Payer: Self-pay | Admitting: Internal Medicine

## 2016-04-30 MED ORDER — WARFARIN SODIUM 2 MG PO TABS: 4.0000 mg | ORAL_TABLET | Freq: Every day | ORAL | 3 refills | Status: DC

## 2016-05-03 ENCOUNTER — Other Ambulatory Visit: Payer: Self-pay | Admitting: Internal Medicine

## 2016-05-03 DIAGNOSIS — Z7901 Long term (current) use of anticoagulants: Secondary | ICD-10-CM | POA: Diagnosis not present

## 2016-05-03 DIAGNOSIS — I480 Paroxysmal atrial fibrillation: Secondary | ICD-10-CM

## 2016-05-04 LAB — PROTIME-INR
INR: 3.2 — ABNORMAL HIGH (ref 0.8–1.2)
Prothrombin Time: 31.5 s — ABNORMAL HIGH (ref 9.1–12.0)

## 2016-05-11 ENCOUNTER — Other Ambulatory Visit: Payer: Self-pay | Admitting: *Deleted

## 2016-05-11 ENCOUNTER — Other Ambulatory Visit: Payer: BLUE CROSS/BLUE SHIELD

## 2016-05-11 DIAGNOSIS — Z7901 Long term (current) use of anticoagulants: Secondary | ICD-10-CM | POA: Diagnosis not present

## 2016-05-11 DIAGNOSIS — I480 Paroxysmal atrial fibrillation: Secondary | ICD-10-CM | POA: Diagnosis not present

## 2016-05-12 LAB — PROTIME-INR
INR: 8.9 (ref 0.8–1.2)
Prothrombin Time: 80.9 s — ABNORMAL HIGH (ref 9.1–12.0)

## 2016-05-14 DIAGNOSIS — Z7901 Long term (current) use of anticoagulants: Secondary | ICD-10-CM | POA: Diagnosis not present

## 2016-05-14 DIAGNOSIS — I480 Paroxysmal atrial fibrillation: Secondary | ICD-10-CM | POA: Diagnosis not present

## 2016-05-15 LAB — PROTIME-INR
INR: 3 — ABNORMAL HIGH (ref 0.8–1.2)
PROTHROMBIN TIME: 29.6 s — AB (ref 9.1–12.0)

## 2016-05-16 ENCOUNTER — Telehealth: Payer: Self-pay | Admitting: Family Medicine

## 2016-05-16 DIAGNOSIS — R079 Chest pain, unspecified: Secondary | ICD-10-CM | POA: Diagnosis not present

## 2016-05-16 DIAGNOSIS — I959 Hypotension, unspecified: Secondary | ICD-10-CM | POA: Diagnosis not present

## 2016-05-16 DIAGNOSIS — Z5181 Encounter for therapeutic drug level monitoring: Secondary | ICD-10-CM | POA: Diagnosis not present

## 2016-05-16 DIAGNOSIS — I447 Left bundle-branch block, unspecified: Secondary | ICD-10-CM | POA: Diagnosis not present

## 2016-05-16 DIAGNOSIS — R7989 Other specified abnormal findings of blood chemistry: Secondary | ICD-10-CM | POA: Diagnosis not present

## 2016-05-16 DIAGNOSIS — R002 Palpitations: Secondary | ICD-10-CM | POA: Diagnosis not present

## 2016-05-16 DIAGNOSIS — Z7901 Long term (current) use of anticoagulants: Secondary | ICD-10-CM | POA: Diagnosis not present

## 2016-05-16 DIAGNOSIS — I4891 Unspecified atrial fibrillation: Secondary | ICD-10-CM | POA: Diagnosis not present

## 2016-05-16 DIAGNOSIS — R9431 Abnormal electrocardiogram [ECG] [EKG]: Secondary | ICD-10-CM | POA: Diagnosis not present

## 2016-05-16 DIAGNOSIS — Z9889 Other specified postprocedural states: Secondary | ICD-10-CM | POA: Diagnosis not present

## 2016-05-16 NOTE — Telephone Encounter (Signed)
Called by Nurse Call line regarding Carmen Graves's INR. Patient apparently had longer run of A. Fib last night and was concerned about what her INR was running. Informed nurse that her INR yesterday was 3.0 and per Dr. Ron Agee note, it looks like she is planning on starting her on eliquis. Advised her to continue to hold her warfarin right now, as unlikely to become subtherapeutic, and that I'd let Dr. Army Melia know what she called. Message forwarded to Dr. Army Melia.

## 2016-05-18 ENCOUNTER — Other Ambulatory Visit: Payer: BLUE CROSS/BLUE SHIELD

## 2016-05-18 ENCOUNTER — Other Ambulatory Visit: Payer: Self-pay | Admitting: Internal Medicine

## 2016-05-18 DIAGNOSIS — I480 Paroxysmal atrial fibrillation: Secondary | ICD-10-CM | POA: Diagnosis not present

## 2016-05-18 DIAGNOSIS — Z7901 Long term (current) use of anticoagulants: Secondary | ICD-10-CM | POA: Diagnosis not present

## 2016-05-19 LAB — PROTIME-INR
INR: 1.3 — AB (ref 0.8–1.2)
Prothrombin Time: 13.7 s — ABNORMAL HIGH (ref 9.1–12.0)

## 2016-06-21 DIAGNOSIS — Z4502 Encounter for adjustment and management of automatic implantable cardiac defibrillator: Secondary | ICD-10-CM | POA: Diagnosis not present

## 2016-07-01 DIAGNOSIS — Z95810 Presence of automatic (implantable) cardiac defibrillator: Secondary | ICD-10-CM | POA: Diagnosis not present

## 2016-07-01 DIAGNOSIS — Z9889 Other specified postprocedural states: Secondary | ICD-10-CM | POA: Diagnosis not present

## 2016-07-01 DIAGNOSIS — I480 Paroxysmal atrial fibrillation: Secondary | ICD-10-CM | POA: Diagnosis not present

## 2016-07-01 DIAGNOSIS — Z4502 Encounter for adjustment and management of automatic implantable cardiac defibrillator: Secondary | ICD-10-CM | POA: Diagnosis not present

## 2016-07-01 DIAGNOSIS — R9431 Abnormal electrocardiogram [ECG] [EKG]: Secondary | ICD-10-CM | POA: Diagnosis not present

## 2016-07-01 DIAGNOSIS — I447 Left bundle-branch block, unspecified: Secondary | ICD-10-CM | POA: Diagnosis not present

## 2016-07-01 DIAGNOSIS — I421 Obstructive hypertrophic cardiomyopathy: Secondary | ICD-10-CM | POA: Diagnosis not present

## 2016-07-05 DIAGNOSIS — I421 Obstructive hypertrophic cardiomyopathy: Secondary | ICD-10-CM | POA: Diagnosis not present

## 2016-09-22 DIAGNOSIS — I472 Ventricular tachycardia: Secondary | ICD-10-CM | POA: Diagnosis not present

## 2016-09-22 DIAGNOSIS — Z4502 Encounter for adjustment and management of automatic implantable cardiac defibrillator: Secondary | ICD-10-CM | POA: Diagnosis not present

## 2016-09-22 DIAGNOSIS — I471 Supraventricular tachycardia: Secondary | ICD-10-CM | POA: Diagnosis not present

## 2016-09-23 DIAGNOSIS — S32592A Other specified fracture of left pubis, initial encounter for closed fracture: Secondary | ICD-10-CM | POA: Diagnosis not present

## 2016-09-23 DIAGNOSIS — M79605 Pain in left leg: Secondary | ICD-10-CM | POA: Diagnosis not present

## 2016-09-23 DIAGNOSIS — S32592D Other specified fracture of left pubis, subsequent encounter for fracture with routine healing: Secondary | ICD-10-CM | POA: Diagnosis not present

## 2016-09-27 DIAGNOSIS — M7542 Impingement syndrome of left shoulder: Secondary | ICD-10-CM | POA: Diagnosis not present

## 2016-10-07 DIAGNOSIS — Z4502 Encounter for adjustment and management of automatic implantable cardiac defibrillator: Secondary | ICD-10-CM | POA: Diagnosis not present

## 2016-10-07 DIAGNOSIS — Z95810 Presence of automatic (implantable) cardiac defibrillator: Secondary | ICD-10-CM | POA: Diagnosis not present

## 2016-10-07 DIAGNOSIS — I421 Obstructive hypertrophic cardiomyopathy: Secondary | ICD-10-CM | POA: Diagnosis not present

## 2016-10-20 ENCOUNTER — Other Ambulatory Visit: Payer: Self-pay | Admitting: Internal Medicine

## 2016-10-27 DIAGNOSIS — Z952 Presence of prosthetic heart valve: Secondary | ICD-10-CM | POA: Diagnosis not present

## 2016-10-27 DIAGNOSIS — Z9889 Other specified postprocedural states: Secondary | ICD-10-CM | POA: Diagnosis not present

## 2016-10-27 DIAGNOSIS — I5032 Chronic diastolic (congestive) heart failure: Secondary | ICD-10-CM | POA: Diagnosis not present

## 2016-10-28 DIAGNOSIS — Z9889 Other specified postprocedural states: Secondary | ICD-10-CM | POA: Diagnosis not present

## 2016-10-28 DIAGNOSIS — Z952 Presence of prosthetic heart valve: Secondary | ICD-10-CM | POA: Diagnosis not present

## 2016-11-01 DIAGNOSIS — Z952 Presence of prosthetic heart valve: Secondary | ICD-10-CM | POA: Diagnosis not present

## 2016-11-01 DIAGNOSIS — Z9889 Other specified postprocedural states: Secondary | ICD-10-CM | POA: Diagnosis not present

## 2016-11-03 DIAGNOSIS — Z952 Presence of prosthetic heart valve: Secondary | ICD-10-CM | POA: Diagnosis not present

## 2016-11-03 DIAGNOSIS — Z9889 Other specified postprocedural states: Secondary | ICD-10-CM | POA: Diagnosis not present

## 2016-11-05 DIAGNOSIS — Z9889 Other specified postprocedural states: Secondary | ICD-10-CM | POA: Diagnosis not present

## 2016-11-05 DIAGNOSIS — Z952 Presence of prosthetic heart valve: Secondary | ICD-10-CM | POA: Diagnosis not present

## 2016-11-08 DIAGNOSIS — Z952 Presence of prosthetic heart valve: Secondary | ICD-10-CM | POA: Diagnosis not present

## 2016-11-08 DIAGNOSIS — Z9889 Other specified postprocedural states: Secondary | ICD-10-CM | POA: Diagnosis not present

## 2016-11-10 DIAGNOSIS — Z952 Presence of prosthetic heart valve: Secondary | ICD-10-CM | POA: Diagnosis not present

## 2016-11-12 DIAGNOSIS — Z952 Presence of prosthetic heart valve: Secondary | ICD-10-CM | POA: Diagnosis not present

## 2016-11-17 DIAGNOSIS — I421 Obstructive hypertrophic cardiomyopathy: Secondary | ICD-10-CM | POA: Diagnosis not present

## 2016-11-17 DIAGNOSIS — R0602 Shortness of breath: Secondary | ICD-10-CM | POA: Diagnosis not present

## 2016-11-17 DIAGNOSIS — Z9889 Other specified postprocedural states: Secondary | ICD-10-CM | POA: Diagnosis not present

## 2016-11-17 DIAGNOSIS — I471 Supraventricular tachycardia: Secondary | ICD-10-CM | POA: Diagnosis not present

## 2016-11-17 DIAGNOSIS — I5032 Chronic diastolic (congestive) heart failure: Secondary | ICD-10-CM | POA: Diagnosis not present

## 2016-11-17 DIAGNOSIS — Z952 Presence of prosthetic heart valve: Secondary | ICD-10-CM | POA: Diagnosis not present

## 2016-11-19 ENCOUNTER — Telehealth: Payer: Self-pay

## 2016-11-19 ENCOUNTER — Other Ambulatory Visit: Payer: Self-pay | Admitting: Internal Medicine

## 2016-11-19 DIAGNOSIS — Z9889 Other specified postprocedural states: Secondary | ICD-10-CM | POA: Diagnosis not present

## 2016-11-19 DIAGNOSIS — I429 Cardiomyopathy, unspecified: Secondary | ICD-10-CM | POA: Diagnosis not present

## 2016-11-19 DIAGNOSIS — Z952 Presence of prosthetic heart valve: Secondary | ICD-10-CM | POA: Diagnosis not present

## 2016-11-19 MED ORDER — SERTRALINE HCL 25 MG PO TABS: 25.0000 mg | ORAL_TABLET | Freq: Every day | ORAL | 5 refills | Status: DC

## 2016-11-19 NOTE — Telephone Encounter (Signed)
Pt informed

## 2016-11-19 NOTE — Telephone Encounter (Signed)
Refill sent in

## 2016-11-19 NOTE — Telephone Encounter (Signed)
Pt called stating she would like refill on her Zoloft medication. Stated she has a appt in August. Pt was last seen in November of last year. Pt would like sent to [place]. Please Advise.

## 2016-11-23 ENCOUNTER — Other Ambulatory Visit: Payer: Self-pay | Admitting: Internal Medicine

## 2016-11-23 MED ORDER — SERTRALINE HCL 25 MG PO TABS: 25.0000 mg | ORAL_TABLET | Freq: Every day | ORAL | 1 refills | Status: DC

## 2016-11-24 DIAGNOSIS — Z9889 Other specified postprocedural states: Secondary | ICD-10-CM | POA: Diagnosis not present

## 2016-11-24 DIAGNOSIS — Z952 Presence of prosthetic heart valve: Secondary | ICD-10-CM | POA: Diagnosis not present

## 2016-11-26 DIAGNOSIS — Z952 Presence of prosthetic heart valve: Secondary | ICD-10-CM | POA: Diagnosis not present

## 2016-11-29 DIAGNOSIS — Z952 Presence of prosthetic heart valve: Secondary | ICD-10-CM | POA: Diagnosis not present

## 2016-11-29 DIAGNOSIS — I429 Cardiomyopathy, unspecified: Secondary | ICD-10-CM | POA: Diagnosis not present

## 2016-11-29 DIAGNOSIS — Z9889 Other specified postprocedural states: Secondary | ICD-10-CM | POA: Diagnosis not present

## 2016-12-06 DIAGNOSIS — Z952 Presence of prosthetic heart valve: Secondary | ICD-10-CM | POA: Diagnosis not present

## 2016-12-06 DIAGNOSIS — Z9889 Other specified postprocedural states: Secondary | ICD-10-CM | POA: Diagnosis not present

## 2016-12-09 DIAGNOSIS — I429 Cardiomyopathy, unspecified: Secondary | ICD-10-CM | POA: Diagnosis not present

## 2016-12-09 DIAGNOSIS — Z952 Presence of prosthetic heart valve: Secondary | ICD-10-CM | POA: Diagnosis not present

## 2016-12-09 DIAGNOSIS — Z9889 Other specified postprocedural states: Secondary | ICD-10-CM | POA: Diagnosis not present

## 2016-12-10 DIAGNOSIS — M546 Pain in thoracic spine: Secondary | ICD-10-CM | POA: Diagnosis not present

## 2016-12-10 DIAGNOSIS — M159 Polyosteoarthritis, unspecified: Secondary | ICD-10-CM | POA: Diagnosis not present

## 2016-12-10 DIAGNOSIS — M542 Cervicalgia: Secondary | ICD-10-CM | POA: Diagnosis not present

## 2016-12-10 DIAGNOSIS — H04321 Acute dacryocystitis of right lacrimal passage: Secondary | ICD-10-CM | POA: Diagnosis not present

## 2016-12-13 DIAGNOSIS — Z9889 Other specified postprocedural states: Secondary | ICD-10-CM | POA: Diagnosis not present

## 2016-12-13 DIAGNOSIS — Z952 Presence of prosthetic heart valve: Secondary | ICD-10-CM | POA: Diagnosis not present

## 2016-12-16 DIAGNOSIS — Z952 Presence of prosthetic heart valve: Secondary | ICD-10-CM | POA: Diagnosis not present

## 2016-12-20 DIAGNOSIS — Z9889 Other specified postprocedural states: Secondary | ICD-10-CM | POA: Diagnosis not present

## 2016-12-20 DIAGNOSIS — Z952 Presence of prosthetic heart valve: Secondary | ICD-10-CM | POA: Diagnosis not present

## 2016-12-24 DIAGNOSIS — Z952 Presence of prosthetic heart valve: Secondary | ICD-10-CM | POA: Diagnosis not present

## 2016-12-24 DIAGNOSIS — I350 Nonrheumatic aortic (valve) stenosis: Secondary | ICD-10-CM | POA: Diagnosis not present

## 2016-12-29 DIAGNOSIS — Z952 Presence of prosthetic heart valve: Secondary | ICD-10-CM | POA: Diagnosis not present

## 2016-12-31 DIAGNOSIS — Z952 Presence of prosthetic heart valve: Secondary | ICD-10-CM | POA: Diagnosis not present

## 2016-12-31 DIAGNOSIS — Z9889 Other specified postprocedural states: Secondary | ICD-10-CM | POA: Diagnosis not present

## 2017-01-03 DIAGNOSIS — I421 Obstructive hypertrophic cardiomyopathy: Secondary | ICD-10-CM | POA: Diagnosis not present

## 2017-01-14 DIAGNOSIS — Z4502 Encounter for adjustment and management of automatic implantable cardiac defibrillator: Secondary | ICD-10-CM | POA: Diagnosis not present

## 2017-01-14 DIAGNOSIS — Z95810 Presence of automatic (implantable) cardiac defibrillator: Secondary | ICD-10-CM | POA: Diagnosis not present

## 2017-01-14 DIAGNOSIS — I472 Ventricular tachycardia: Secondary | ICD-10-CM | POA: Diagnosis not present

## 2017-01-20 ENCOUNTER — Ambulatory Visit (INDEPENDENT_AMBULATORY_CARE_PROVIDER_SITE_OTHER): Payer: BLUE CROSS/BLUE SHIELD | Admitting: Internal Medicine

## 2017-01-20 VITALS — BP 104/62 | HR 70 | Ht 64.5 in | Wt 133.4 lb

## 2017-01-20 DIAGNOSIS — Z1239 Encounter for other screening for malignant neoplasm of breast: Secondary | ICD-10-CM

## 2017-01-20 DIAGNOSIS — F329 Major depressive disorder, single episode, unspecified: Secondary | ICD-10-CM | POA: Diagnosis not present

## 2017-01-20 DIAGNOSIS — Z0000 Encounter for general adult medical examination without abnormal findings: Secondary | ICD-10-CM

## 2017-01-20 DIAGNOSIS — Z23 Encounter for immunization: Secondary | ICD-10-CM

## 2017-01-20 DIAGNOSIS — I421 Obstructive hypertrophic cardiomyopathy: Secondary | ICD-10-CM | POA: Diagnosis not present

## 2017-01-20 DIAGNOSIS — E785 Hyperlipidemia, unspecified: Secondary | ICD-10-CM

## 2017-01-20 DIAGNOSIS — F419 Anxiety disorder, unspecified: Secondary | ICD-10-CM

## 2017-01-20 DIAGNOSIS — F32A Depression, unspecified: Secondary | ICD-10-CM

## 2017-01-20 DIAGNOSIS — I480 Paroxysmal atrial fibrillation: Secondary | ICD-10-CM | POA: Diagnosis not present

## 2017-01-20 NOTE — Progress Notes (Signed)
Date:  01/20/2017   Name:  Carmen Graves   DOB:  09-14-1955   MRN:  [id number]   Chief Complaint: Annual Exam (See's GYN- does not want breast exam. ) MAGGI HERSHKOWITZ is a 61 y.o. female who presents today for her Complete Annual Exam. She feels fairly well. She reports exercising rehab. She reports she is sleeping fairly well. No breast issues - mammogram due this fall.  Has GYN for pelvic exams. She wants to defer mammogram for a while longer due to ongoing chest wall pain.  Depression         The problem has been resolved since onset.  Associated symptoms include no fatigue and no headaches.  Past treatments include SSRIs - Selective serotonin reuptake inhibitors.  Compliance with treatment is good.  Previous treatment provided significant relief.  Paroxysmal Afib - on metoprolol and Eliquis.  AICD in place.  Being monitored regularly by Cardiology. She has had one episode of Afib in the past 8 months.  No bleeding issues.  HCM - s/p cardiac surgery - now in cardiac rehab and slowing regaining strength.  Labs done at [facility] in June showed normal CBC and CMP.   Depression - doing very well.  Continues on Zoloft daily.  Hyperlipidemia - has been borderline high but not on any medication at this time. She continues to watch her diet, limit fried foods and is exercising with cardiac rehab twice a week.  Review of Systems  Constitutional: Negative for chills, fatigue and fever.  HENT: Negative for congestion, hearing loss, tinnitus, trouble swallowing and voice change.   Eyes: Negative for visual disturbance.  Respiratory: Negative for cough, chest tightness, shortness of breath and wheezing.   Cardiovascular: Negative for chest pain, palpitations and leg swelling.       Chest wall pain  Gastrointestinal: Negative for abdominal pain, constipation, diarrhea and vomiting.  Endocrine: Negative for polydipsia and polyuria.  Genitourinary: Negative for dysuria, frequency, genital sores,  vaginal bleeding and vaginal discharge.  Musculoskeletal: Negative for arthralgias, gait problem and joint swelling.  Skin: Negative for color change and rash.  Neurological: Negative for dizziness, tremors, light-headedness and headaches.  Hematological: Negative for adenopathy. Does not bruise/bleed easily.  Psychiatric/Behavioral: Positive for depression. Negative for dysphoric mood and sleep disturbance. The patient is not nervous/anxious.     Patient Active Problem List   Diagnosis Date Noted  . Need for prophylactic antibiotic 04/08/2016  . On warfarin therapy 04/08/2016  . Status post cardiac surgery 04/08/2016  . CHF (congestive heart failure), NYHA class III, chronic, diastolic ([place]) 32/20/2542  . Paroxysmal atrial fibrillation ([place]) 06/20/2015  . Abnormal genetic test 05/08/2015  . Presence of automatic cardioverter/defibrillator (AICD) 03/13/2015  . Anxiety and depression 03/07/2015  . Hyperlipidemia, mild 03/05/2015  . Osteoporosis 03/05/2015  . Hypertrophic obstructive cardiomyopathy ([place]) 02/27/2015  . Neurocardiogenic syncope 02/23/2015    Prior to Admission medications   Medication Sig Start Date End Date Taking? Authorizing Provider  acetaminophen (TYLENOL) 650 MG CR tablet Take by mouth. 04/07/16  Yes [provider]  apixaban (ELIQUIS) 5 MG TABS tablet Take 5 mg by mouth 2 (two) times daily.   Yes [provider]  metoprolol succinate (TOPROL-XL) 25 MG 24 hr tablet Take 1 tablet by mouth 2 (two) times daily.  04/16/16 04/16/17 Yes [provider]  sertraline (ZOLOFT) 25 MG tablet Take 1 tablet (25 mg total) by mouth daily. 11/23/16  Yes Glean Hess, MD  vitamin C (ASCORBIC ACID)  500 MG tablet Take 500 mg by mouth daily.   Yes [provider]    Allergies  Allergen Reactions  . Albuterol Shortness Of Breath    Chest pain and SOB  . Budesonide-Formoterol Fumarate Shortness Of Breath    Chest pain, severe SOB   .  Fluticasone-Salmeterol Shortness Of Breath  . Ipratropium-Albuterol Shortness Of Breath    Chest pain  . Sulfa Antibiotics Other (See Comments)    Causes kidney disease  . Codeine Nausea Only  . Prednisone Other (See Comments)    Chest pain and tightness    Past Surgical History:  Procedure Laterality Date  . BONY PELVIS SURGERY     tore muscle from pelvic bone  . BREAST BIOPSY Bilateral 15+ YRS AGO   NEG  . BREAST CYST ASPIRATION Bilateral   . CARDIAC DEFIBRILLATOR PLACEMENT  01/2015   for HOCM  . TONSILLECTOMY AND ADENOIDECTOMY    . UMBILICAL HERNIA REPAIR      Social History  Substance Use Topics  . Smoking status: Never Smoker  . Smokeless tobacco: Never Used  . Alcohol use No   Depression screen [facility] 2/9 01/20/2017  Decreased Interest 0  Down, Depressed, Hopeless 0  PHQ - 2 Score 0  Altered sleeping 0  Tired, decreased energy 0  Change in appetite 0  Feeling bad or failure about yourself  0  Trouble concentrating 0  Moving slowly or fidgety/restless 0  Suicidal thoughts 0  PHQ-9 Score 0  Difficult doing work/chores Not difficult at all     Medication list has been reviewed and updated.   Physical Exam  Constitutional: She is oriented to person, place, and time. She appears well-developed. No distress.  HENT:  Head: Normocephalic and atraumatic.  Neck: Normal range of motion. Neck supple. No thyromegaly present.  Cardiovascular: Normal rate, regular rhythm and normal heart sounds.   Pulmonary/Chest: Effort normal and breath sounds normal. No respiratory distress. She has no wheezes.    Abdominal: Soft. Bowel sounds are normal. She exhibits no distension. There is no tenderness.  Musculoskeletal: Normal range of motion. She exhibits no edema or tenderness.  Neurological: She is alert and oriented to person, place, and time.  Skin: Skin is warm and dry. No rash noted.  Psychiatric: She has a normal mood and affect. Her behavior is normal. Thought content  normal.  Nursing note and vitals reviewed.   BP 104/62   Pulse 70   Ht 5' 4.5" (1.638 m)   Wt 133 lb 6.4 oz (60.5 kg)   SpO2 98%   BMI 22.54 kg/m   Assessment and Plan: 1. Annual physical exam Making progress with stamina Will defer colonoscopy at this time  2. Breast cancer screening Defer for now  3. Paroxysmal atrial fibrillation (HCC) Controlled; continue Eliquis - Comprehensive metabolic panel - TSH  4. Hyperlipidemia, mild Check labs - Lipid panel  5. Anxiety and depression Doing well on zoloft  6. Hypertrophic obstructive cardiomyopathy ([place]) S/p myomectomy  7. Need for influenza vaccination - Flu Vaccine QUAD 36+ mos IM   No orders of the defined types were placed in this encounter.   Halina Maidens, MD [facility]  01/20/2017

## 2017-01-20 NOTE — Patient Instructions (Signed)
Health Maintenance  Topic Date Due  . HIV Screening  09/02/1968  . COLONOSCOPY  09/03/2003  . PAP SMEAR  05/18/2010  . INFLUENZA VACCINE  02/28/2017 (Originally 12/29/2016)  . MAMMOGRAM  03/17/2017  . DEXA SCAN  04/04/2017  . TETANUS/TDAP  11/21/2024  . Hepatitis C Screening  Completed

## 2017-01-21 LAB — LIPID PANEL
CHOL/HDL RATIO: 4.7 ratio — AB (ref 0.0–4.4)
Cholesterol, Total: 199 mg/dL (ref 100–199)
HDL: 42 mg/dL (ref >39)
LDL CALC: 120 mg/dL — AB (ref 0–99)
Triglycerides: 185 mg/dL — ABNORMAL HIGH (ref 0–149)
VLDL CHOLESTEROL CAL: 37 mg/dL (ref 5–40)

## 2017-01-21 LAB — TSH: TSH: 0.576 u[IU]/mL (ref 0.450–4.500)

## 2017-01-21 LAB — COMPREHENSIVE METABOLIC PANEL
ALBUMIN: 4.7 g/dL (ref 3.6–4.8)
ALT: 24 IU/L (ref 0–32)
AST: 21 IU/L (ref 0–40)
Albumin/Globulin Ratio: 2.6 — ABNORMAL HIGH (ref 1.2–2.2)
Alkaline Phosphatase: 83 IU/L (ref 39–117)
BILIRUBIN TOTAL: 0.6 mg/dL (ref 0.0–1.2)
BUN / CREAT RATIO: 20 (ref 12–28)
BUN: 13 mg/dL (ref 8–27)
CALCIUM: 9.5 mg/dL (ref 8.7–10.3)
CO2: 23 mmol/L (ref 20–29)
CREATININE: 0.65 mg/dL (ref 0.57–1.00)
Chloride: 104 mmol/L (ref 96–106)
GFR, EST AFRICAN AMERICAN: 109 mL/min/{1.73_m2} (ref >59)
GFR, EST NON AFRICAN AMERICAN: 95 mL/min/{1.73_m2} (ref >59)
GLUCOSE: 73 mg/dL (ref 65–99)
Globulin, Total: 1.8 g/dL (ref 1.5–4.5)
Potassium: 4.5 mmol/L (ref 3.5–5.2)
Sodium: 142 mmol/L (ref 134–144)
TOTAL PROTEIN: 6.5 g/dL (ref 6.0–8.5)

## 2017-01-22 DIAGNOSIS — S199XXA Unspecified injury of neck, initial encounter: Secondary | ICD-10-CM | POA: Diagnosis not present

## 2017-01-22 DIAGNOSIS — Z043 Encounter for examination and observation following other accident: Secondary | ICD-10-CM | POA: Diagnosis not present

## 2017-01-22 DIAGNOSIS — S52611A Displaced fracture of right ulna styloid process, initial encounter for closed fracture: Secondary | ICD-10-CM | POA: Diagnosis not present

## 2017-01-22 DIAGNOSIS — S52551A Other extraarticular fracture of lower end of right radius, initial encounter for closed fracture: Secondary | ICD-10-CM | POA: Diagnosis not present

## 2017-01-22 DIAGNOSIS — Y33XXXA Other specified events, undetermined intent, initial encounter: Secondary | ICD-10-CM | POA: Diagnosis not present

## 2017-01-22 DIAGNOSIS — S52552A Other extraarticular fracture of lower end of left radius, initial encounter for closed fracture: Secondary | ICD-10-CM | POA: Diagnosis not present

## 2017-01-22 DIAGNOSIS — S52571A Other intraarticular fracture of lower end of right radius, initial encounter for closed fracture: Secondary | ICD-10-CM | POA: Diagnosis not present

## 2017-01-22 DIAGNOSIS — S52591A Other fractures of lower end of right radius, initial encounter for closed fracture: Secondary | ICD-10-CM | POA: Diagnosis not present

## 2017-01-22 DIAGNOSIS — W19XXXA Unspecified fall, initial encounter: Secondary | ICD-10-CM | POA: Diagnosis not present

## 2017-01-22 DIAGNOSIS — S52501A Unspecified fracture of the lower end of right radius, initial encounter for closed fracture: Secondary | ICD-10-CM | POA: Diagnosis not present

## 2017-01-22 DIAGNOSIS — M79605 Pain in left leg: Secondary | ICD-10-CM | POA: Diagnosis not present

## 2017-01-22 DIAGNOSIS — S4991XA Unspecified injury of right shoulder and upper arm, initial encounter: Secondary | ICD-10-CM | POA: Diagnosis not present

## 2017-01-22 DIAGNOSIS — M542 Cervicalgia: Secondary | ICD-10-CM | POA: Diagnosis not present

## 2017-01-22 DIAGNOSIS — M8580 Other specified disorders of bone density and structure, unspecified site: Secondary | ICD-10-CM | POA: Diagnosis not present

## 2017-01-24 DIAGNOSIS — S52531A Colles' fracture of right radius, initial encounter for closed fracture: Secondary | ICD-10-CM | POA: Diagnosis not present

## 2017-01-26 DIAGNOSIS — M25531 Pain in right wrist: Secondary | ICD-10-CM | POA: Diagnosis not present

## 2017-01-26 DIAGNOSIS — S52501A Unspecified fracture of the lower end of right radius, initial encounter for closed fracture: Secondary | ICD-10-CM | POA: Diagnosis not present

## 2017-01-26 DIAGNOSIS — M810 Age-related osteoporosis without current pathological fracture: Secondary | ICD-10-CM | POA: Diagnosis not present

## 2017-02-02 DIAGNOSIS — S52501A Unspecified fracture of the lower end of right radius, initial encounter for closed fracture: Secondary | ICD-10-CM | POA: Diagnosis not present

## 2017-02-09 DIAGNOSIS — S52501A Unspecified fracture of the lower end of right radius, initial encounter for closed fracture: Secondary | ICD-10-CM | POA: Diagnosis not present

## 2017-02-16 DIAGNOSIS — S52501A Unspecified fracture of the lower end of right radius, initial encounter for closed fracture: Secondary | ICD-10-CM | POA: Diagnosis not present

## 2017-03-02 DIAGNOSIS — S52501A Unspecified fracture of the lower end of right radius, initial encounter for closed fracture: Secondary | ICD-10-CM | POA: Diagnosis not present

## 2017-03-30 DIAGNOSIS — S52501A Unspecified fracture of the lower end of right radius, initial encounter for closed fracture: Secondary | ICD-10-CM | POA: Diagnosis not present

## 2017-04-15 DIAGNOSIS — M47816 Spondylosis without myelopathy or radiculopathy, lumbar region: Secondary | ICD-10-CM | POA: Diagnosis not present

## 2017-04-15 DIAGNOSIS — M47812 Spondylosis without myelopathy or radiculopathy, cervical region: Secondary | ICD-10-CM | POA: Diagnosis not present

## 2017-04-20 DIAGNOSIS — Z4502 Encounter for adjustment and management of automatic implantable cardiac defibrillator: Secondary | ICD-10-CM | POA: Diagnosis not present

## 2017-04-20 DIAGNOSIS — Z95810 Presence of automatic (implantable) cardiac defibrillator: Secondary | ICD-10-CM | POA: Diagnosis not present

## 2017-04-20 DIAGNOSIS — I472 Ventricular tachycardia: Secondary | ICD-10-CM | POA: Diagnosis not present

## 2017-05-04 ENCOUNTER — Other Ambulatory Visit: Payer: Self-pay | Admitting: Internal Medicine

## 2017-05-11 DIAGNOSIS — S52501A Unspecified fracture of the lower end of right radius, initial encounter for closed fracture: Secondary | ICD-10-CM | POA: Diagnosis not present

## 2017-08-03 DIAGNOSIS — M542 Cervicalgia: Secondary | ICD-10-CM | POA: Diagnosis not present

## 2017-08-03 DIAGNOSIS — M25532 Pain in left wrist: Secondary | ICD-10-CM | POA: Diagnosis not present

## 2017-08-03 DIAGNOSIS — M25531 Pain in right wrist: Secondary | ICD-10-CM | POA: Diagnosis not present

## 2017-08-03 DIAGNOSIS — M545 Low back pain: Secondary | ICD-10-CM | POA: Diagnosis not present

## 2017-08-17 DIAGNOSIS — S52501A Unspecified fracture of the lower end of right radius, initial encounter for closed fracture: Secondary | ICD-10-CM | POA: Diagnosis not present

## 2017-08-31 DIAGNOSIS — M25531 Pain in right wrist: Secondary | ICD-10-CM | POA: Diagnosis not present

## 2017-08-31 DIAGNOSIS — R29898 Other symptoms and signs involving the musculoskeletal system: Secondary | ICD-10-CM | POA: Diagnosis not present

## 2017-09-14 DIAGNOSIS — M25531 Pain in right wrist: Secondary | ICD-10-CM | POA: Diagnosis not present

## 2017-09-14 DIAGNOSIS — R29898 Other symptoms and signs involving the musculoskeletal system: Secondary | ICD-10-CM | POA: Diagnosis not present

## 2017-09-28 DIAGNOSIS — M25531 Pain in right wrist: Secondary | ICD-10-CM | POA: Diagnosis not present

## 2017-09-28 DIAGNOSIS — R29898 Other symptoms and signs involving the musculoskeletal system: Secondary | ICD-10-CM | POA: Diagnosis not present

## 2017-09-29 DIAGNOSIS — I472 Ventricular tachycardia: Secondary | ICD-10-CM | POA: Diagnosis not present

## 2017-09-29 DIAGNOSIS — I421 Obstructive hypertrophic cardiomyopathy: Secondary | ICD-10-CM | POA: Diagnosis not present

## 2017-09-29 DIAGNOSIS — Z4502 Encounter for adjustment and management of automatic implantable cardiac defibrillator: Secondary | ICD-10-CM | POA: Diagnosis not present

## 2017-09-29 DIAGNOSIS — Z95810 Presence of automatic (implantable) cardiac defibrillator: Secondary | ICD-10-CM | POA: Diagnosis not present

## 2017-09-29 DIAGNOSIS — Z9889 Other specified postprocedural states: Secondary | ICD-10-CM | POA: Diagnosis not present

## 2017-09-29 DIAGNOSIS — Z79899 Other long term (current) drug therapy: Secondary | ICD-10-CM | POA: Diagnosis not present

## 2017-09-29 DIAGNOSIS — Z8679 Personal history of other diseases of the circulatory system: Secondary | ICD-10-CM | POA: Diagnosis not present

## 2017-10-03 DIAGNOSIS — M542 Cervicalgia: Secondary | ICD-10-CM | POA: Diagnosis not present

## 2017-10-03 DIAGNOSIS — M25512 Pain in left shoulder: Secondary | ICD-10-CM | POA: Diagnosis not present

## 2017-10-10 DIAGNOSIS — M25512 Pain in left shoulder: Secondary | ICD-10-CM | POA: Diagnosis not present

## 2017-10-10 DIAGNOSIS — M6281 Muscle weakness (generalized): Secondary | ICD-10-CM | POA: Diagnosis not present

## 2017-10-10 DIAGNOSIS — M542 Cervicalgia: Secondary | ICD-10-CM | POA: Diagnosis not present

## 2017-10-19 DIAGNOSIS — M25531 Pain in right wrist: Secondary | ICD-10-CM | POA: Diagnosis not present

## 2017-10-19 DIAGNOSIS — S52501A Unspecified fracture of the lower end of right radius, initial encounter for closed fracture: Secondary | ICD-10-CM | POA: Diagnosis not present

## 2017-10-19 DIAGNOSIS — R29898 Other symptoms and signs involving the musculoskeletal system: Secondary | ICD-10-CM | POA: Diagnosis not present

## 2017-10-20 DIAGNOSIS — I421 Obstructive hypertrophic cardiomyopathy: Secondary | ICD-10-CM | POA: Diagnosis not present

## 2017-10-20 DIAGNOSIS — I480 Paroxysmal atrial fibrillation: Secondary | ICD-10-CM | POA: Diagnosis not present

## 2017-10-20 DIAGNOSIS — I5032 Chronic diastolic (congestive) heart failure: Secondary | ICD-10-CM | POA: Diagnosis not present

## 2017-10-27 DIAGNOSIS — Z01419 Encounter for gynecological examination (general) (routine) without abnormal findings: Secondary | ICD-10-CM | POA: Diagnosis not present

## 2017-10-27 DIAGNOSIS — R1032 Left lower quadrant pain: Secondary | ICD-10-CM | POA: Diagnosis not present

## 2017-10-27 DIAGNOSIS — N952 Postmenopausal atrophic vaginitis: Secondary | ICD-10-CM | POA: Diagnosis not present

## 2017-10-27 DIAGNOSIS — Z1151 Encounter for screening for human papillomavirus (HPV): Secondary | ICD-10-CM | POA: Diagnosis not present

## 2017-11-03 DIAGNOSIS — M542 Cervicalgia: Secondary | ICD-10-CM | POA: Diagnosis not present

## 2017-11-03 DIAGNOSIS — M6281 Muscle weakness (generalized): Secondary | ICD-10-CM | POA: Diagnosis not present

## 2017-11-03 DIAGNOSIS — M25512 Pain in left shoulder: Secondary | ICD-10-CM | POA: Diagnosis not present

## 2017-11-08 DIAGNOSIS — S52501A Unspecified fracture of the lower end of right radius, initial encounter for closed fracture: Secondary | ICD-10-CM | POA: Diagnosis not present

## 2017-11-11 IMAGING — MG MM DIGITAL SCREENING BILAT W/ TOMO W/ CAD
8 of 14 series · 8 of 30 positions shown · non-contrast
Comparison: Previous exam(s).

CLINICAL DATA: Screening.

EXAM:
2D DIGITAL SCREENING BILATERAL MAMMOGRAM WITH CAD AND ADJUNCT TOMO

[L MLO synth-2D]
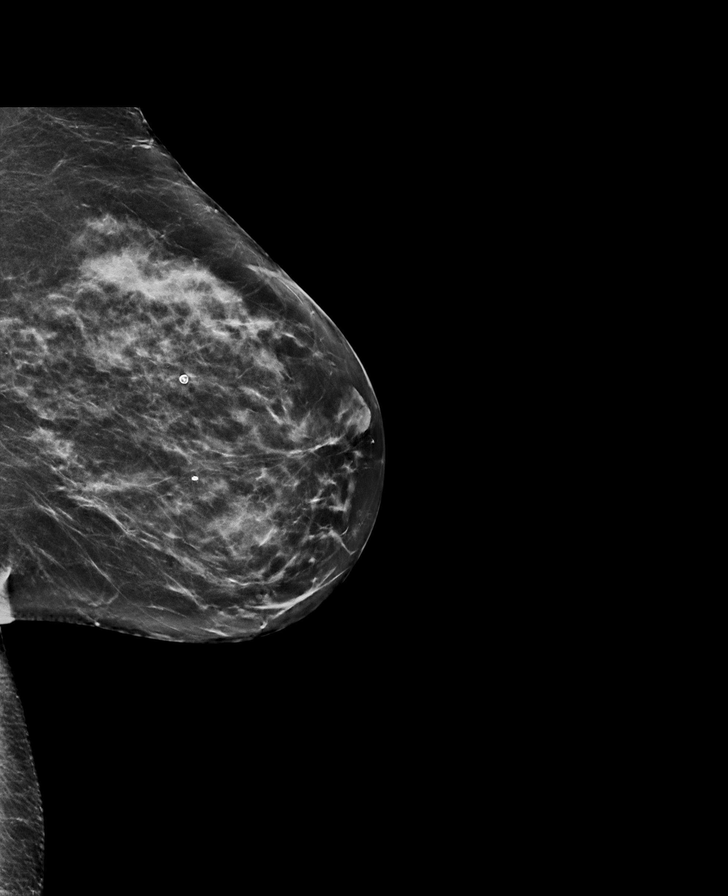

[R CC]
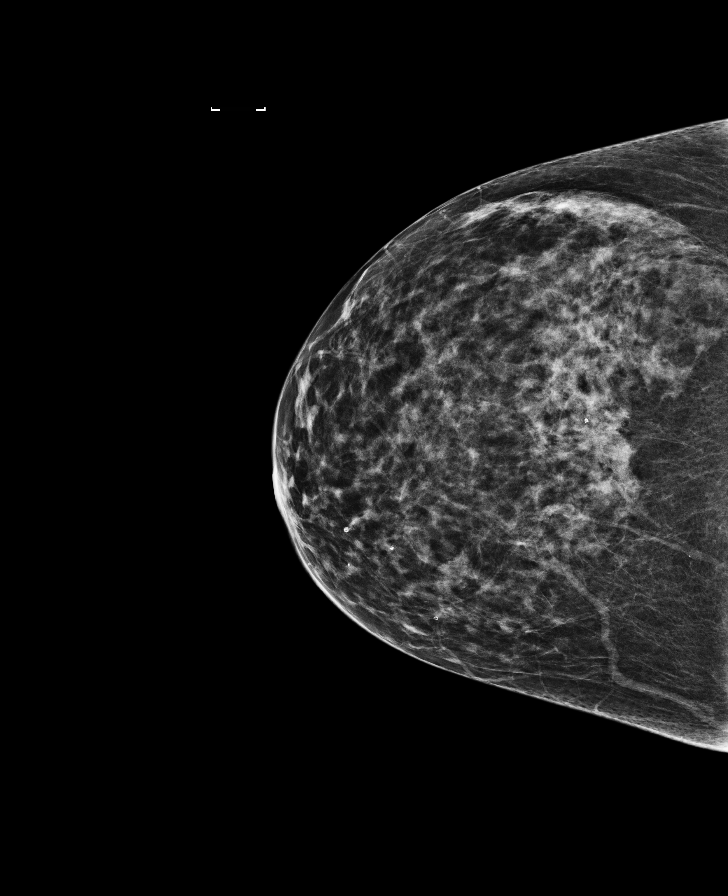

[L CC]
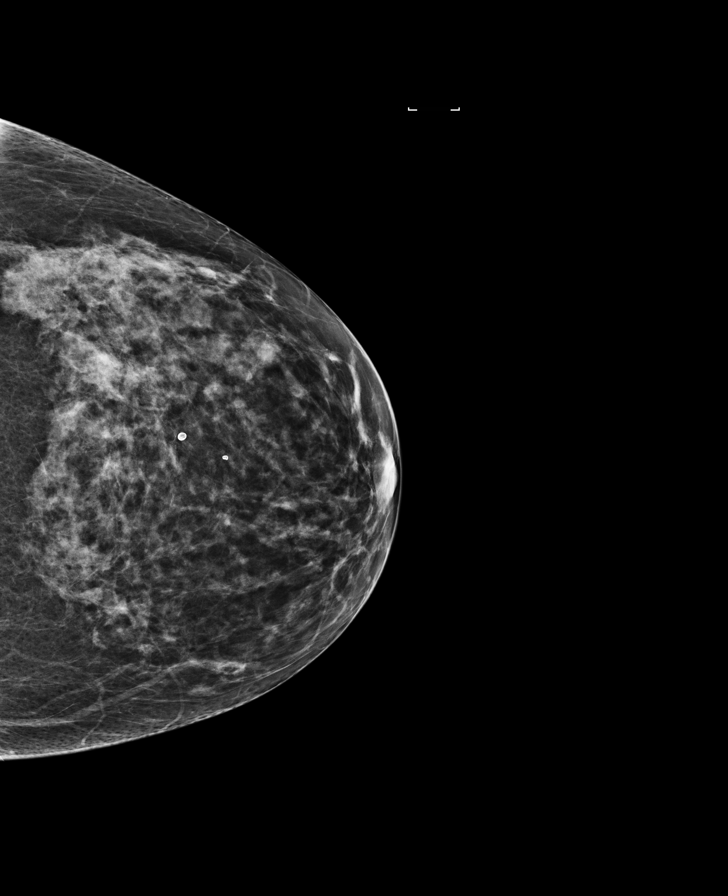

[R MLO]
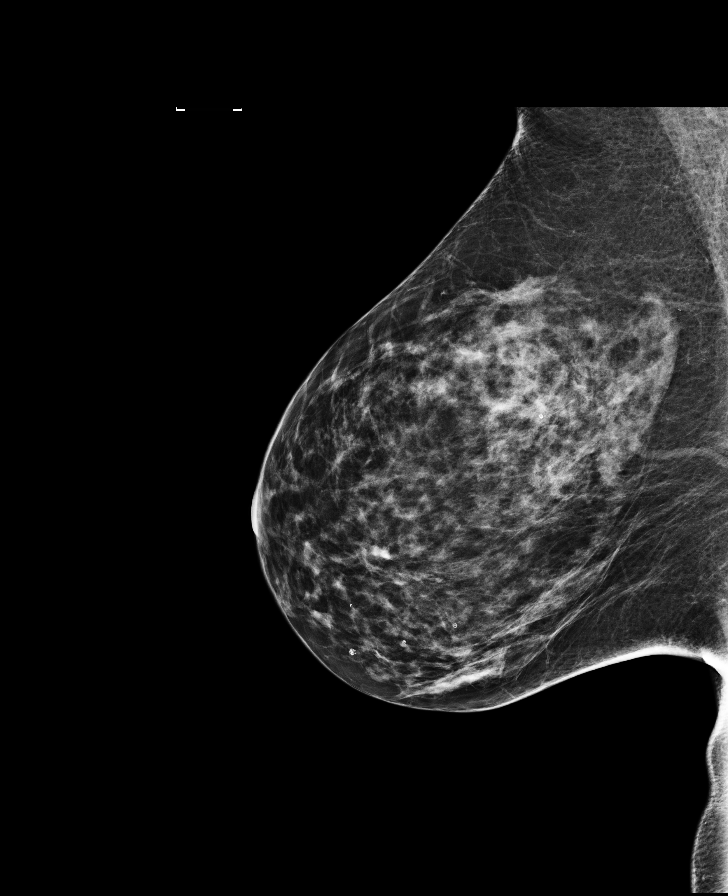

[R MLO synth-2D]
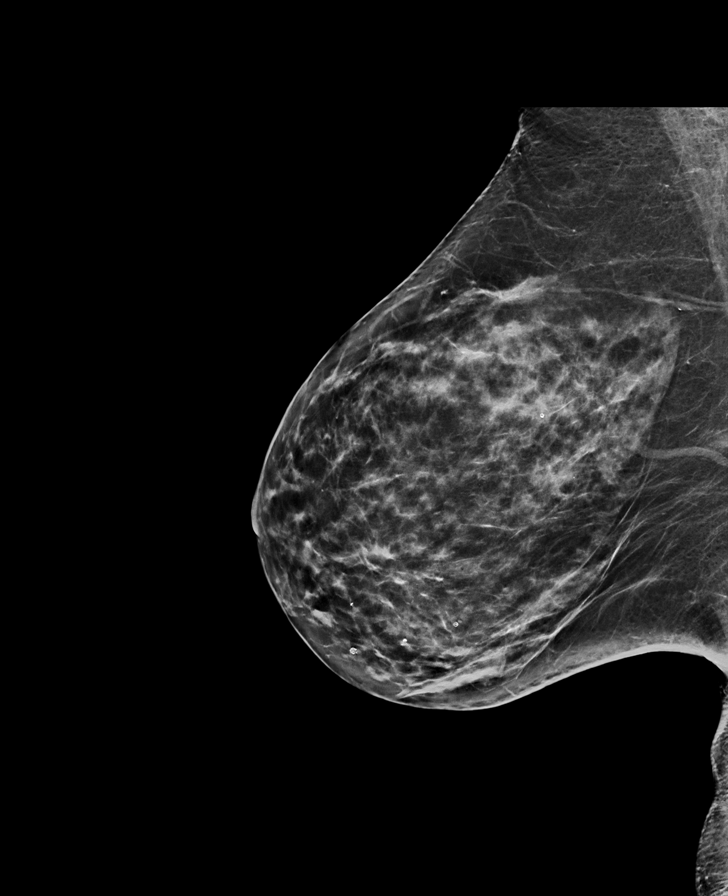

[R CC synth-2D]
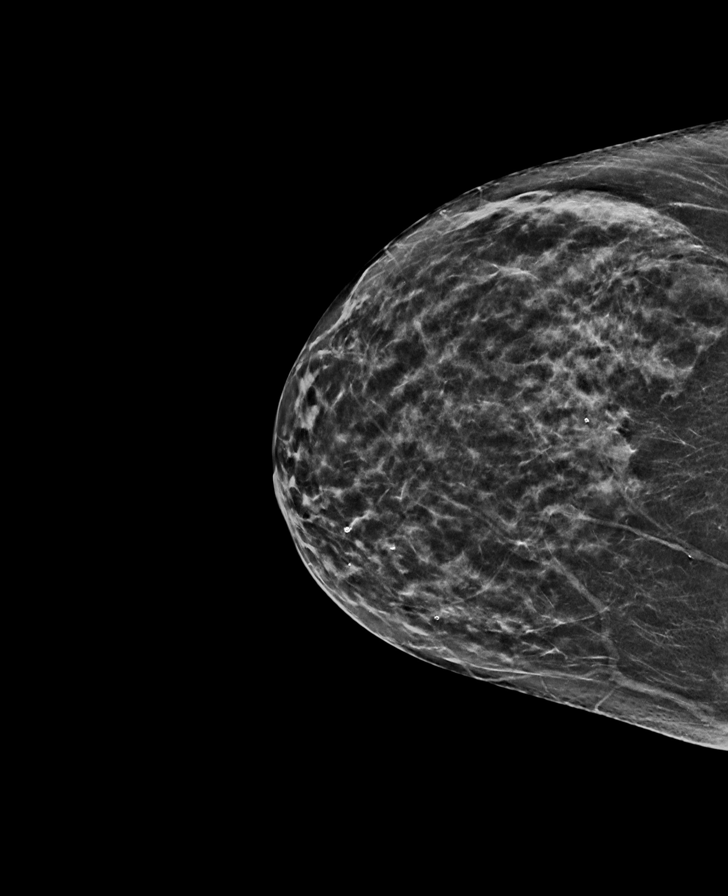

[L MLO]
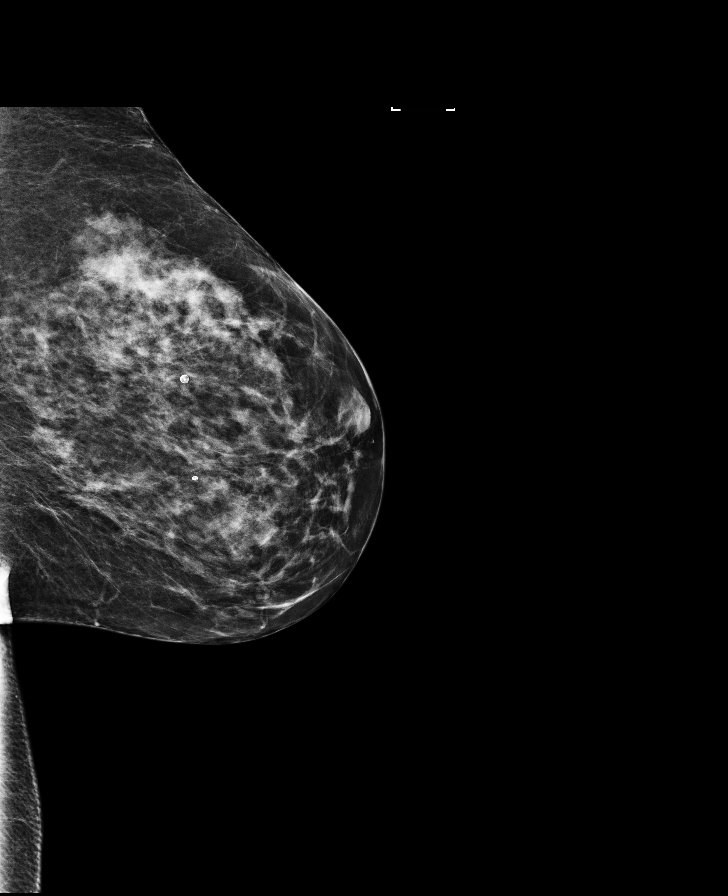

[L CC synth-2D]
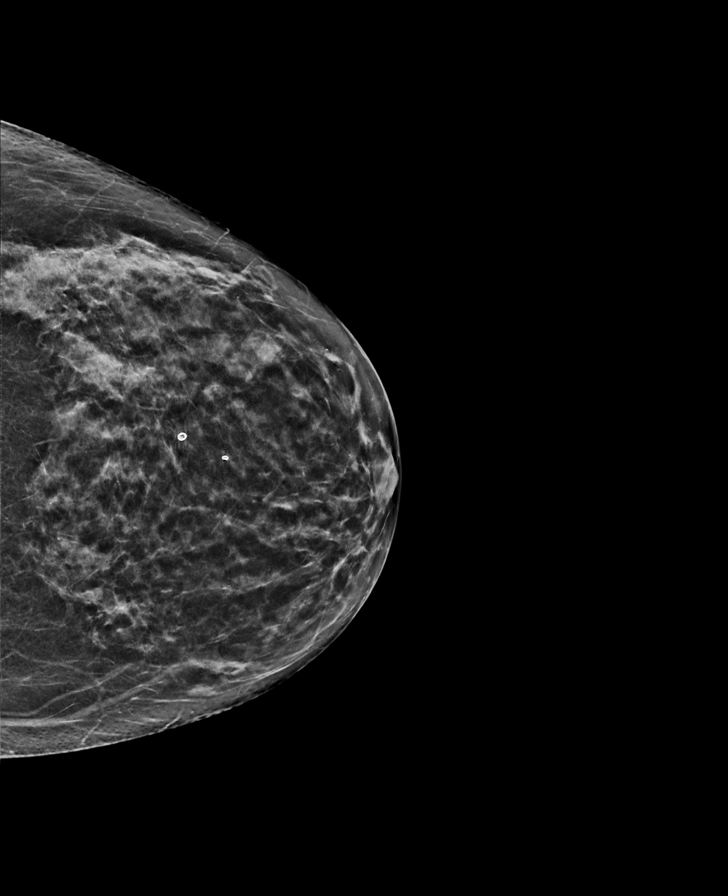

[8 of 30 positions shown; findings below may reference images not displayed]

ACR Breast Density Category c: The breast tissue is heterogeneously
dense, which may obscure small masses.
FINDINGS: In the left breast, a possible asymmetry warrants further
evaluation. In the right breast, no findings suspicious for
malignancy. Images were processed with CAD.
IMPRESSION: Further evaluation is suggested for possible asymmetry in the left
breast.

RECOMMENDATION:
Diagnostic mammogram and possibly ultrasound of the left breast.
(Code:82-T-IIG)

The patient will be contacted regarding the findings, and additional
imaging will be scheduled.

BI-RADS CATEGORY  0: Incomplete. Need additional imaging evaluation
and/or prior mammograms for comparison.

## 2017-11-23 DIAGNOSIS — M810 Age-related osteoporosis without current pathological fracture: Secondary | ICD-10-CM | POA: Diagnosis not present

## 2017-12-02 DIAGNOSIS — M452 Ankylosing spondylitis of cervical region: Secondary | ICD-10-CM | POA: Diagnosis not present

## 2017-12-02 DIAGNOSIS — M454 Ankylosing spondylitis of thoracic region: Secondary | ICD-10-CM | POA: Diagnosis not present

## 2018-01-06 ENCOUNTER — Other Ambulatory Visit: Payer: Self-pay | Admitting: Internal Medicine

## 2018-01-20 ENCOUNTER — Encounter: Payer: Self-pay | Admitting: Internal Medicine

## 2018-01-20 ENCOUNTER — Ambulatory Visit (INDEPENDENT_AMBULATORY_CARE_PROVIDER_SITE_OTHER): Payer: BLUE CROSS/BLUE SHIELD | Admitting: Internal Medicine

## 2018-01-20 VITALS — BP 104/60 | HR 83 | Ht 64.0 in | Wt 135.0 lb

## 2018-01-20 DIAGNOSIS — Z0000 Encounter for general adult medical examination without abnormal findings: Secondary | ICD-10-CM | POA: Diagnosis not present

## 2018-01-20 DIAGNOSIS — Z23 Encounter for immunization: Secondary | ICD-10-CM | POA: Diagnosis not present

## 2018-01-20 DIAGNOSIS — M810 Age-related osteoporosis without current pathological fracture: Secondary | ICD-10-CM

## 2018-01-20 DIAGNOSIS — F329 Major depressive disorder, single episode, unspecified: Secondary | ICD-10-CM | POA: Diagnosis not present

## 2018-01-20 DIAGNOSIS — E785 Hyperlipidemia, unspecified: Secondary | ICD-10-CM

## 2018-01-20 DIAGNOSIS — F419 Anxiety disorder, unspecified: Secondary | ICD-10-CM | POA: Diagnosis not present

## 2018-01-20 DIAGNOSIS — I480 Paroxysmal atrial fibrillation: Secondary | ICD-10-CM

## 2018-01-20 DIAGNOSIS — E559 Vitamin D deficiency, unspecified: Secondary | ICD-10-CM | POA: Diagnosis not present

## 2018-01-20 DIAGNOSIS — Z1211 Encounter for screening for malignant neoplasm of colon: Secondary | ICD-10-CM

## 2018-01-20 DIAGNOSIS — R5383 Other fatigue: Secondary | ICD-10-CM

## 2018-01-20 DIAGNOSIS — I421 Obstructive hypertrophic cardiomyopathy: Secondary | ICD-10-CM | POA: Diagnosis not present

## 2018-01-20 DIAGNOSIS — F32A Depression, unspecified: Secondary | ICD-10-CM

## 2018-01-20 LAB — POCT URINALYSIS DIPSTICK
BILIRUBIN UA: NEGATIVE
Blood, UA: NEGATIVE
GLUCOSE UA: NEGATIVE
Ketones, UA: NEGATIVE
Leukocytes, UA: NEGATIVE
Nitrite, UA: NEGATIVE
PH UA: 6 (ref 5.0–8.0)
Protein, UA: NEGATIVE
Spec Grav, UA: 1.02 (ref 1.010–1.025)
UROBILINOGEN UA: 0.2 U/dL

## 2018-01-20 NOTE — Progress Notes (Signed)
Date:  01/20/2018   Name:  Carmen Graves   DOB:  12-02-1955   MRN:  [id number]   Chief Complaint: Annual Exam Carmen Graves is a 62 y.o. female who presents today for her Complete Annual Exam. She feels fairly well. She reports exercising walking. She reports she is sleeping well.  She recently had GYN exam with pap and mammogram is being delayed due to chest wall pain.  Depression         This is a chronic problem.The problem is unchanged.  Associated symptoms include fatigue.  Associated symptoms include no headaches.  Past treatments include SSRIs - Selective serotonin reuptake inhibitors.  Compliance with treatment is good.  Previous treatment provided significant relief.  Atrial fibrillation - no further episodes but take metoprolol and Eliquis.  She has not had to take the Cardiazem. She feels better since decreasing the dose of metoprolol - BP is not as low and her energy is improving.  She has some vague fatigue that she believes was from the beta blocker but would like to have thyroid checked.  OP - started Actonel last year.  DEXA next week. She is being followed by Endocrinology.  No swallowing issues, heartburn or abdominal pain.  Not taking calcium or vitamin d per the record.  Will need to check levels.  Review of Systems  Constitutional: Positive for fatigue. Negative for chills and fever.  HENT: Negative for congestion, hearing loss, tinnitus, trouble swallowing and voice change.   Eyes: Negative for visual disturbance.  Respiratory: Negative for cough, chest tightness, shortness of breath and wheezing.   Cardiovascular: Negative for chest pain, palpitations and leg swelling.  Gastrointestinal: Negative for abdominal pain, constipation, diarrhea and vomiting.  Endocrine: Negative for polydipsia and polyuria.  Genitourinary: Negative for dysuria, frequency, genital sores, vaginal bleeding and vaginal discharge.  Musculoskeletal: Negative for arthralgias, gait problem and  joint swelling.  Skin: Negative for color change and rash.  Neurological: Negative for dizziness, tremors, light-headedness and headaches.  Hematological: Negative for adenopathy. Does not bruise/bleed easily.  Psychiatric/Behavioral: Positive for depression. Negative for dysphoric mood and sleep disturbance. The patient is not nervous/anxious.     Patient Active Problem List   Diagnosis Date Noted  . Need for prophylactic antibiotic 04/08/2016  . On warfarin therapy 04/08/2016  . Status post cardiac surgery 04/08/2016  . CHF (congestive heart failure), NYHA class III, chronic, diastolic ([place]) 32/35/5732  . Paroxysmal atrial fibrillation ([place]) 06/20/2015  . Abnormal genetic test 05/08/2015  . Presence of automatic cardioverter/defibrillator (AICD) 03/13/2015  . Anxiety and depression 03/07/2015  . Hyperlipidemia, mild 03/05/2015  . Osteoporosis 03/05/2015  . Hypertrophic obstructive cardiomyopathy ([place]) 02/27/2015  . Neurocardiogenic syncope 02/23/2015    Allergies  Allergen Reactions  . Albuterol Shortness Of Breath    Chest pain and SOB  . Budesonide-Formoterol Fumarate Shortness Of Breath    Chest pain, severe SOB   . Fluticasone-Salmeterol Shortness Of Breath  . Ipratropium-Albuterol Shortness Of Breath    Chest pain  . Codeine Nausea Only  . Nsaids Other (See Comments)    Causes kidney disease   . Penicillins   . Prednisone Other (See Comments)    Chest pain and tightness    Past Surgical History:  Procedure Laterality Date  . BONY PELVIS SURGERY     tore muscle from pelvic bone  . BREAST BIOPSY Bilateral 15+ YRS AGO   NEG  . BREAST CYST ASPIRATION Bilateral   . CARDIAC DEFIBRILLATOR PLACEMENT  01/2015   for HOCM  . MYOMECTOMY  06/2015  . TONSILLECTOMY AND ADENOIDECTOMY    . UMBILICAL HERNIA REPAIR      Social History   Tobacco Use  . Smoking status: Never Smoker  . Smokeless tobacco: Never Used  Substance Use Topics  . Alcohol use: No    Alcohol/week:  0.0 standard drinks  . Drug use: No     Medication list has been reviewed and updated.  Current Meds  Medication Sig  . acetaminophen (TYLENOL) 650 MG CR tablet Take by mouth.  Marland Kitchen apixaban (ELIQUIS) 5 MG TABS tablet Take 5 mg by mouth daily.   Marland Kitchen diltiazem (CARDIZEM) 30 MG tablet Take 30 mg by mouth as needed.  . metoprolol tartrate (LOPRESSOR) 25 MG tablet Take 12.5 mg by mouth daily.   . risedronate (ACTONEL) 150 MG tablet Take 150 mg by mouth every 30 (thirty) days. with water on empty stomach, nothing by mouth or lie down for next 30 minutes.  . sertraline (ZOLOFT) 25 MG tablet TAKE 1 TABLET BY MOUTH EVERY DAY  . vitamin C (ASCORBIC ACID) 500 MG tablet Take 500 mg by mouth daily.    PHQ 2/9 Scores 01/20/2017  PHQ - 2 Score 0  PHQ- 9 Score 0    Physical Exam  Constitutional: She is oriented to person, place, and time. She appears well-developed and well-nourished. No distress.  HENT:  Head: Normocephalic and atraumatic.  Right Ear: Tympanic membrane and ear canal normal.  Left Ear: Tympanic membrane and ear canal normal.  Nose: Right sinus exhibits no maxillary sinus tenderness. Left sinus exhibits no maxillary sinus tenderness.  Mouth/Throat: Uvula is midline and oropharynx is clear and moist.  Eyes: Conjunctivae and EOM are normal. Right eye exhibits no discharge. Left eye exhibits no discharge. No scleral icterus.  Neck: Normal range of motion. Carotid bruit is not present. No erythema present. No thyromegaly present.  Cardiovascular: Normal rate, regular rhythm, normal heart sounds and normal pulses.  Pulmonary/Chest: Effort normal. No respiratory distress. She has no wheezes.  Abdominal: Soft. Bowel sounds are normal. There is no hepatosplenomegaly. There is no tenderness. There is no CVA tenderness.  Musculoskeletal: Normal range of motion.  Lymphadenopathy:    She has no cervical adenopathy.    She has no axillary adenopathy.  Neurological: She is alert and oriented to  person, place, and time. She has normal reflexes. No cranial nerve deficit or sensory deficit.  Skin: Skin is warm, dry and intact. No rash noted.  Psychiatric: She has a normal mood and affect. Her speech is normal and behavior is normal. Thought content normal.  Nursing note and vitals reviewed.   BP 104/60 (BP Location: Right Arm, Patient Position: Sitting, Cuff Size: Normal)   Pulse 83   Ht 5\' 4"  (1.626 m)   Wt 135 lb (61.2 kg)   SpO2 98%   BMI 23.17 kg/m   Assessment and Plan: 1. Annual physical exam Hold off on mammogram Request Pap report from GYN - POCT urinalysis dipstick  2. Hypertrophic obstructive cardiomyopathy ([place]) S/p Myomectomy  3. Paroxysmal atrial fibrillation (HCC) None recurred; continue low dose beta blocker and DOAC - CBC with Differential/Platelet - Comprehensive metabolic panel - Magnesium  4. Anxiety and depression Doing well on current medication - TSH  5. Hyperlipidemia, mild Following low fat diet - Lipid panel  6. Colon cancer screening - Cologuard  7. Vitamin D deficiency - VITAMIN D 25 Hydroxy (Vit-D Deficiency, Fractures)  8. Osteoporosis, unspecified osteoporosis  type, unspecified pathological fracture presence Now on bisphosphonate DEXA next week by Endo - VITAMIN D 25 Hydroxy (Vit-D Deficiency, Fractures)  9. Fatigue, unspecified type - Vitamin B12  10. Need for influenza vaccination - Flu Vaccine QUAD 36+ mos IM   No orders of the defined types were placed in this encounter.   Partially dictated using Editor, commissioning. Any errors are unintentional.  Halina Maidens, MD [facility]  01/20/2018

## 2018-01-21 LAB — CBC WITH DIFFERENTIAL/PLATELET
BASOS ABS: 0 10*3/uL (ref 0.0–0.2)
Basos: 1 %
EOS (ABSOLUTE): 0 10*3/uL (ref 0.0–0.4)
EOS: 1 %
HEMOGLOBIN: 13.7 g/dL (ref 11.1–15.9)
Hematocrit: 42.2 % (ref 34.0–46.6)
IMMATURE GRANS (ABS): 0 10*3/uL (ref 0.0–0.1)
Immature Granulocytes: 0 %
LYMPHS ABS: 1.4 10*3/uL (ref 0.7–3.1)
Lymphs: 31 %
MCH: 30.2 pg (ref 26.6–33.0)
MCHC: 32.5 g/dL (ref 31.5–35.7)
MCV: 93 fL (ref 79–97)
MONOCYTES: 8 %
Monocytes Absolute: 0.4 10*3/uL (ref 0.1–0.9)
NEUTROS ABS: 2.8 10*3/uL (ref 1.4–7.0)
Neutrophils: 59 %
Platelets: 199 10*3/uL (ref 150–450)
RBC: 4.54 x10E6/uL (ref 3.77–5.28)
RDW: 12.6 % (ref 12.3–15.4)
WBC: 4.7 10*3/uL (ref 3.4–10.8)

## 2018-01-21 LAB — COMPREHENSIVE METABOLIC PANEL
ALBUMIN: 4.7 g/dL (ref 3.6–4.8)
ALT: 18 IU/L (ref 0–32)
AST: 22 IU/L (ref 0–40)
Albumin/Globulin Ratio: 2.1 (ref 1.2–2.2)
Alkaline Phosphatase: 60 IU/L (ref 39–117)
BUN / CREAT RATIO: 19 (ref 12–28)
BUN: 13 mg/dL (ref 8–27)
Bilirubin Total: 0.6 mg/dL (ref 0.0–1.2)
CO2: 20 mmol/L (ref 20–29)
CREATININE: 0.7 mg/dL (ref 0.57–1.00)
Calcium: 9.4 mg/dL (ref 8.7–10.3)
Chloride: 106 mmol/L (ref 96–106)
GFR, EST AFRICAN AMERICAN: 106 mL/min/{1.73_m2} (ref >59)
GFR, EST NON AFRICAN AMERICAN: 92 mL/min/{1.73_m2} (ref >59)
GLOBULIN, TOTAL: 2.2 g/dL (ref 1.5–4.5)
GLUCOSE: 80 mg/dL (ref 65–99)
Potassium: 4.5 mmol/L (ref 3.5–5.2)
SODIUM: 143 mmol/L (ref 134–144)
TOTAL PROTEIN: 6.9 g/dL (ref 6.0–8.5)

## 2018-01-21 LAB — VITAMIN D 25 HYDROXY (VIT D DEFICIENCY, FRACTURES): VIT D 25 HYDROXY: 51 ng/mL (ref 30.0–100.0)

## 2018-01-21 LAB — TSH: TSH: 0.681 u[IU]/mL (ref 0.450–4.500)

## 2018-01-21 LAB — LIPID PANEL
CHOL/HDL RATIO: 4.6 ratio — AB (ref 0.0–4.4)
Cholesterol, Total: 223 mg/dL — ABNORMAL HIGH (ref 100–199)
HDL: 49 mg/dL (ref >39)
LDL CALC: 144 mg/dL — AB (ref 0–99)
Triglycerides: 148 mg/dL (ref 0–149)
VLDL CHOLESTEROL CAL: 30 mg/dL (ref 5–40)

## 2018-01-21 LAB — MAGNESIUM: Magnesium: 2.3 mg/dL (ref 1.6–2.3)

## 2018-01-21 LAB — VITAMIN B12: VITAMIN B 12: 392 pg/mL (ref 232–1245)

## 2018-01-25 DIAGNOSIS — Z8781 Personal history of (healed) traumatic fracture: Secondary | ICD-10-CM | POA: Diagnosis not present

## 2018-01-25 DIAGNOSIS — M810 Age-related osteoporosis without current pathological fracture: Secondary | ICD-10-CM | POA: Diagnosis not present

## 2018-02-02 LAB — HM DEXA SCAN

## 2018-02-06 DIAGNOSIS — Z95810 Presence of automatic (implantable) cardiac defibrillator: Secondary | ICD-10-CM | POA: Diagnosis not present

## 2018-02-06 DIAGNOSIS — Z4502 Encounter for adjustment and management of automatic implantable cardiac defibrillator: Secondary | ICD-10-CM | POA: Diagnosis not present

## 2018-04-18 DIAGNOSIS — I429 Cardiomyopathy, unspecified: Secondary | ICD-10-CM | POA: Diagnosis not present

## 2018-04-18 DIAGNOSIS — K5900 Constipation, unspecified: Secondary | ICD-10-CM | POA: Diagnosis not present

## 2018-05-03 ENCOUNTER — Encounter: Payer: Self-pay | Admitting: Internal Medicine

## 2018-05-03 ENCOUNTER — Ambulatory Visit: Payer: BLUE CROSS/BLUE SHIELD | Admitting: Internal Medicine

## 2018-05-03 VITALS — BP 102/60 | HR 116 | Temp 98.6°F | Ht 64.0 in | Wt 134.0 lb

## 2018-05-03 DIAGNOSIS — J0100 Acute maxillary sinusitis, unspecified: Secondary | ICD-10-CM | POA: Diagnosis not present

## 2018-05-03 MED ORDER — AZITHROMYCIN 250 MG PO TABS: ORAL_TABLET | ORAL | 0 refills | Status: AC

## 2018-05-03 MED ORDER — GUAIFENESIN-CODEINE 100-10 MG/5ML PO SYRP: 5.0000 mL | ORAL_SOLUTION | Freq: Three times a day (TID) | ORAL | 0 refills | Status: DC | PRN

## 2018-05-03 NOTE — Progress Notes (Signed)
Date:  05/03/2018   Name:  Carmen Graves   DOB:  10/12/1955   MRN:  [id number]   Chief Complaint: Cough (Cough with chest congestion. Started last Tuesday. Yesterday had fever of 104 with Tylenol and throat had blisters in back of mouth. Cough with yellow congestion. OTC meds not helping. )  Cough  This is a new problem. The current episode started in the past 7 days. The problem has been gradually worsening. The problem occurs every few minutes. The cough is productive of sputum. Associated symptoms include a fever (low grade), headaches, nasal congestion, postnasal drip and a sore throat. Pertinent negatives include no chest pain, chills, myalgias, shortness of breath or wheezing. The symptoms are aggravated by lying down. She has tried OTC cough suppressant for the symptoms. The treatment provided mild relief. There is no history of environmental allergies.  Sore Throat   This is a new problem. The current episode started in the past 7 days. The problem has been gradually improving. Neither side of throat is experiencing more pain than the other. The maximum temperature recorded prior to her arrival was 100.4 - 100.9 F. Associated symptoms include congestion, coughing and headaches. Pertinent negatives include no shortness of breath or trouble swallowing.    Review of Systems  Constitutional: Positive for fever (low grade). Negative for chills.  HENT: Positive for congestion, postnasal drip and sore throat. Negative for trouble swallowing.   Respiratory: Positive for cough. Negative for chest tightness, shortness of breath and wheezing.   Cardiovascular: Negative for chest pain and palpitations.  Musculoskeletal: Negative for myalgias.  Allergic/Immunologic: Negative for environmental allergies.  Neurological: Positive for headaches. Negative for dizziness.    Patient Active Problem List   Diagnosis Date Noted  . Need for prophylactic antibiotic 04/08/2016  . On warfarin therapy  04/08/2016  . Status post cardiac surgery 04/08/2016  . CHF (congestive heart failure), NYHA class III, chronic, diastolic ([place]) 81/05/7508  . Paroxysmal atrial fibrillation ([place]) 06/20/2015  . Abnormal genetic test 05/08/2015  . Presence of automatic cardioverter/defibrillator (AICD) 03/13/2015  . Anxiety and depression 03/07/2015  . Hyperlipidemia, mild 03/05/2015  . Osteoporosis 03/05/2015  . Hypertrophic obstructive cardiomyopathy ([place]) 02/27/2015  . Neurocardiogenic syncope 02/23/2015    Allergies  Allergen Reactions  . Albuterol Shortness Of Breath    Chest pain and SOB  . Budesonide-Formoterol Fumarate Shortness Of Breath    Chest pain, severe SOB   . Fluticasone-Salmeterol Shortness Of Breath  . Ipratropium-Albuterol Shortness Of Breath    Chest pain  . Codeine Nausea Only  . Nsaids Other (See Comments)    Causes kidney disease   . Penicillins   . Prednisone Other (See Comments)    Chest pain and tightness    Past Surgical History:  Procedure Laterality Date  . BONY PELVIS SURGERY     tore muscle from pelvic bone  . BREAST BIOPSY Bilateral 15+ YRS AGO   NEG  . BREAST CYST ASPIRATION Bilateral   . CARDIAC DEFIBRILLATOR PLACEMENT  01/2015   for HOCM  . MYOMECTOMY  06/2015  . TONSILLECTOMY AND ADENOIDECTOMY    . UMBILICAL HERNIA REPAIR      Social History   Tobacco Use  . Smoking status: Never Smoker  . Smokeless tobacco: Never Used  Substance Use Topics  . Alcohol use: No    Alcohol/week: 0.0 standard drinks  . Drug use: No     Medication list has been reviewed and updated.  Current Meds  Medication  Sig  . acetaminophen (TYLENOL) 650 MG CR tablet Take by mouth.  Marland Kitchen apixaban (ELIQUIS) 5 MG TABS tablet Take 5 mg by mouth daily.   Marland Kitchen diltiazem (CARDIZEM) 30 MG tablet Take 30 mg by mouth as needed.  . metoprolol tartrate (LOPRESSOR) 25 MG tablet Take 12.5 mg by mouth daily.   . risedronate (ACTONEL) 150 MG tablet Take 150 mg by mouth every 30 (thirty)  days. with water on empty stomach, nothing by mouth or lie down for next 30 minutes.  . sertraline (ZOLOFT) 25 MG tablet TAKE 1 TABLET BY MOUTH EVERY DAY  . vitamin C (ASCORBIC ACID) 500 MG tablet Take 500 mg by mouth daily.    PHQ 2/9 Scores 05/03/2018 01/20/2018 01/20/2017  PHQ - 2 Score 0 0 0  PHQ- 9 Score 0 1 0    Physical Exam  Constitutional: She is oriented to person, place, and time. She appears well-developed and well-nourished. No distress.  HENT:  Head: Normocephalic and atraumatic.  Right Ear: External ear and ear canal normal. Tympanic membrane is not erythematous and not retracted.  Left Ear: External ear and ear canal normal. Tympanic membrane is not erythematous and not retracted.  Nose: Right sinus exhibits maxillary sinus tenderness and frontal sinus tenderness. Left sinus exhibits maxillary sinus tenderness and frontal sinus tenderness.  Mouth/Throat: Uvula is midline and mucous membranes are normal. No oral lesions. Posterior oropharyngeal erythema present. No oropharyngeal exudate.  Cardiovascular: Normal rate, regular rhythm and normal heart sounds.  Pulmonary/Chest: Effort normal and breath sounds normal. No respiratory distress. She has no decreased breath sounds. She has no wheezes. She has no rales.  Musculoskeletal: Normal range of motion.  Lymphadenopathy:    She has no cervical adenopathy.  Neurological: She is alert and oriented to person, place, and time.  Skin: Skin is warm and dry. No rash noted.  Psychiatric: She has a normal mood and affect. Her behavior is normal. Thought content normal.  Nursing note and vitals reviewed.   BP 102/60 (BP Location: Right Arm, Patient Position: Sitting, Cuff Size: Normal)   Pulse (!) 116   Temp 98.6 F (37 C) (Oral)   Ht 5\' 4"  (1.626 m)   Wt 134 lb (60.8 kg)   SpO2 97%   BMI 23.00 kg/m   Assessment and Plan: 1. Acute non-recurrent maxillary sinusitis Continue over the counter cough suppressant - azithromycin  (ZITHROMAX Z-PAK) 250 MG tablet; UAD  Dispense: 6 each; Refill: 0 - guaiFENesin-codeine (ROBITUSSIN AC) 100-10 MG/5ML syrup; Take 5 mLs by mouth 3 (three) times daily as needed for cough.  Dispense: 118 mL; Refill: 0   Partially dictated using Editor, commissioning. Any errors are unintentional.  Halina Maidens, MD [facility]  05/03/2018

## 2018-05-08 DIAGNOSIS — I480 Paroxysmal atrial fibrillation: Secondary | ICD-10-CM | POA: Diagnosis not present

## 2018-05-08 DIAGNOSIS — Z95810 Presence of automatic (implantable) cardiac defibrillator: Secondary | ICD-10-CM | POA: Diagnosis not present

## 2018-05-08 DIAGNOSIS — Z4502 Encounter for adjustment and management of automatic implantable cardiac defibrillator: Secondary | ICD-10-CM | POA: Diagnosis not present

## 2018-05-08 DIAGNOSIS — I472 Ventricular tachycardia: Secondary | ICD-10-CM | POA: Diagnosis not present

## 2018-05-08 DIAGNOSIS — I471 Supraventricular tachycardia: Secondary | ICD-10-CM | POA: Diagnosis not present

## 2018-05-08 DIAGNOSIS — Z7901 Long term (current) use of anticoagulants: Secondary | ICD-10-CM | POA: Diagnosis not present

## 2018-07-03 ENCOUNTER — Other Ambulatory Visit: Payer: Self-pay | Admitting: Internal Medicine

## 2018-07-27 DIAGNOSIS — H16223 Keratoconjunctivitis sicca, not specified as Sjogren's, bilateral: Secondary | ICD-10-CM | POA: Diagnosis not present

## 2018-08-07 DIAGNOSIS — Z7901 Long term (current) use of anticoagulants: Secondary | ICD-10-CM | POA: Diagnosis not present

## 2018-08-07 DIAGNOSIS — I480 Paroxysmal atrial fibrillation: Secondary | ICD-10-CM | POA: Diagnosis not present

## 2018-08-07 DIAGNOSIS — Z4502 Encounter for adjustment and management of automatic implantable cardiac defibrillator: Secondary | ICD-10-CM | POA: Diagnosis not present

## 2018-08-07 DIAGNOSIS — Z95810 Presence of automatic (implantable) cardiac defibrillator: Secondary | ICD-10-CM | POA: Diagnosis not present

## 2018-08-07 DIAGNOSIS — I471 Supraventricular tachycardia: Secondary | ICD-10-CM | POA: Diagnosis not present

## 2018-08-07 DIAGNOSIS — I472 Ventricular tachycardia: Secondary | ICD-10-CM | POA: Diagnosis not present

## 2018-10-09 DIAGNOSIS — I480 Paroxysmal atrial fibrillation: Secondary | ICD-10-CM | POA: Diagnosis not present

## 2019-01-22 ENCOUNTER — Encounter: Payer: BLUE CROSS/BLUE SHIELD | Admitting: Internal Medicine

## 2019-01-23 ENCOUNTER — Encounter: Payer: BLUE CROSS/BLUE SHIELD | Admitting: Internal Medicine

## 2019-01-25 ENCOUNTER — Encounter: Payer: BLUE CROSS/BLUE SHIELD | Admitting: Internal Medicine

## 2019-02-01 ENCOUNTER — Encounter: Payer: Self-pay | Admitting: Internal Medicine

## 2019-02-01 ENCOUNTER — Ambulatory Visit (INDEPENDENT_AMBULATORY_CARE_PROVIDER_SITE_OTHER): Payer: Medicare Other | Admitting: Internal Medicine

## 2019-02-01 ENCOUNTER — Other Ambulatory Visit: Payer: Self-pay

## 2019-02-01 VITALS — BP 134/78 | HR 78 | Ht 64.0 in | Wt 133.0 lb

## 2019-02-01 DIAGNOSIS — F32A Depression, unspecified: Secondary | ICD-10-CM

## 2019-02-01 DIAGNOSIS — F329 Major depressive disorder, single episode, unspecified: Secondary | ICD-10-CM | POA: Diagnosis not present

## 2019-02-01 DIAGNOSIS — E782 Mixed hyperlipidemia: Secondary | ICD-10-CM | POA: Diagnosis not present

## 2019-02-01 DIAGNOSIS — I480 Paroxysmal atrial fibrillation: Secondary | ICD-10-CM | POA: Diagnosis not present

## 2019-02-01 DIAGNOSIS — Z23 Encounter for immunization: Secondary | ICD-10-CM

## 2019-02-01 DIAGNOSIS — E538 Deficiency of other specified B group vitamins: Secondary | ICD-10-CM

## 2019-02-01 DIAGNOSIS — Z1211 Encounter for screening for malignant neoplasm of colon: Secondary | ICD-10-CM

## 2019-02-01 DIAGNOSIS — M810 Age-related osteoporosis without current pathological fracture: Secondary | ICD-10-CM

## 2019-02-01 DIAGNOSIS — F419 Anxiety disorder, unspecified: Secondary | ICD-10-CM

## 2019-02-01 DIAGNOSIS — I421 Obstructive hypertrophic cardiomyopathy: Secondary | ICD-10-CM | POA: Diagnosis not present

## 2019-02-01 DIAGNOSIS — Z1231 Encounter for screening mammogram for malignant neoplasm of breast: Secondary | ICD-10-CM

## 2019-02-01 MED ORDER — SHINGRIX 50 MCG/0.5ML IM SUSR: 0.5000 mL | Freq: Once | INTRAMUSCULAR | 1 refills | Status: AC

## 2019-02-01 NOTE — Progress Notes (Signed)
Date:  02/01/2019   Name:  Carmen Graves   DOB:  04/21/1956   MRN:  [id number]   Chief Complaint: Annual Exam (No pap. Breast exam.) Carmen Graves is a 63 y.o. female who presents today for her Complete Annual Exam. She feels well. She reports exercising doing gardening and walking 2 miles per day. She reports she is sleeping well. She is now retired and enjoying it.  She denies breast issues but is hesitant to get a mammogram due to persistent chest pain tenderness from cardiac surgery.  Mammogram  02/2016 Colonoscopy - none DEXA  01/2018 PPV-23 due in 06/2019  HOCM/Afib - per cardiology 10/2018:  1) HOCM (hypertrophic obstructive cardiomyopathy) (CMS-HCC) [I42.1]   NYHA 1 sx s/p myectomy without residual LVOT obstruction. She has primary prevention ICD without any device therapies.  2) paroxysmal atrial fibrillation Device monitoring has shown occasional episodes of atrial tachycardia longest episode 7 seconds. Remains on low-dose metoprolol as well as apixaban for stroke prevention.  Depression        This is a chronic problem.  The problem has been resolved since onset.  Associated symptoms include no fatigue and no headaches.  Past treatments include SSRIs - Selective serotonin reuptake inhibitors.  Compliance with treatment is good.  Previous treatment provided significant relief. OP - still taking actonel and calcium + vitamin D from Endocrinology.  Review of Systems  Constitutional: Negative for chills, fatigue and fever.  HENT: Negative for congestion, hearing loss, tinnitus, trouble swallowing and voice change.   Eyes: Negative for visual disturbance.  Respiratory: Negative for cough, chest tightness, shortness of breath and wheezing.   Cardiovascular: Negative for chest pain, palpitations and leg swelling.  Gastrointestinal: Negative for abdominal pain, constipation, diarrhea and vomiting.  Endocrine: Negative for polydipsia and polyuria.  Genitourinary: Negative for  dysuria, frequency, genital sores, vaginal bleeding and vaginal discharge.  Musculoskeletal: Negative for arthralgias, gait problem and joint swelling.  Skin: Negative for color change and rash.  Neurological: Negative for dizziness, tremors, light-headedness and headaches.  Hematological: Negative for adenopathy. Does not bruise/bleed easily.  Psychiatric/Behavioral: Positive for depression. Negative for dysphoric mood and sleep disturbance. The patient is not nervous/anxious.     Patient Active Problem List   Diagnosis Date Noted  . Need for prophylactic antibiotic 04/08/2016  . Status post cardiac surgery 04/08/2016  . Paroxysmal atrial fibrillation ([place]) 06/20/2015  . Abnormal genetic test 05/08/2015  . Presence of automatic cardioverter/defibrillator (AICD) 03/13/2015  . Anxiety and depression 03/07/2015  . Hyperlipidemia, mild 03/05/2015  . Osteoporosis 03/05/2015  . Hypertrophic obstructive cardiomyopathy ([place]) 02/27/2015  . Neurocardiogenic syncope 02/23/2015    Allergies  Allergen Reactions  . Albuterol Shortness Of Breath    Chest pain and SOB  . Budesonide-Formoterol Fumarate Shortness Of Breath    Chest pain, severe SOB   . Fluticasone-Salmeterol Shortness Of Breath  . Ipratropium-Albuterol Shortness Of Breath    Chest pain  . Codeine Nausea Only  . Nsaids Other (See Comments)    Causes kidney disease   . Penicillins   . Prednisone Other (See Comments)    Chest pain and tightness    Past Surgical History:  Procedure Laterality Date  . BONY PELVIS SURGERY     tore muscle from pelvic bone  . BREAST BIOPSY Bilateral 15+ YRS AGO   NEG  . BREAST CYST ASPIRATION Bilateral   . CARDIAC DEFIBRILLATOR PLACEMENT  01/2015   for HOCM  . MYOMECTOMY  06/2015  . TONSILLECTOMY  AND ADENOIDECTOMY    . UMBILICAL HERNIA REPAIR      Social History   Tobacco Use  . Smoking status: Never Smoker  . Smokeless tobacco: Never Used  Substance Use Topics  . Alcohol use: No     Alcohol/week: 0.0 standard drinks  . Drug use: No     Medication list has been reviewed and updated.  Current Meds  Medication Sig  . acetaminophen (TYLENOL) 650 MG CR tablet Take by mouth.  Marland Kitchen apixaban (ELIQUIS) 5 MG TABS tablet Take 5 mg by mouth 2 (two) times daily.   . Cholecalciferol (VITAMIN D3) 50 MCG (2000 UT) capsule Take 2 capsules by mouth daily. 2000 units  . diltiazem (CARDIZEM) 30 MG tablet Take 30 mg by mouth as needed.  . ferrous sulfate (SLOW FE) 160 (50 Fe) MG TBCR SR tablet Take 1 tablet by mouth. Monday Wednesday and Fridays  . metoprolol tartrate (LOPRESSOR) 25 MG tablet Take 12.5 mg by mouth daily.   . risedronate (ACTONEL) 150 MG tablet Take 150 mg by mouth every 30 (thirty) days. with water on empty stomach, nothing by mouth or lie down for next 30 minutes.  . sertraline (ZOLOFT) 25 MG tablet TAKE 1 TABLET BY MOUTH EVERY DAY  . vitamin C (ASCORBIC ACID) 500 MG tablet Take 500 mg by mouth daily.    PHQ 2/9 Scores 02/01/2019 05/03/2018 01/20/2018 01/20/2017  PHQ - 2 Score 0 0 0 0  PHQ- 9 Score 1 0 1 0    BP Readings from Last 3 Encounters:  02/01/19 134/78  05/03/18 102/60  01/20/18 104/60    Physical Exam Vitals signs and nursing note reviewed.  Constitutional:      General: She is not in acute distress.    Appearance: She is well-developed.  HENT:     Head: Normocephalic and atraumatic.     Right Ear: Tympanic membrane and ear canal normal.     Left Ear: Tympanic membrane and ear canal normal.     Nose:     Right Sinus: No maxillary sinus tenderness.     Left Sinus: No maxillary sinus tenderness.  Eyes:     General: No scleral icterus.       Right eye: No discharge.        Left eye: No discharge.     Conjunctiva/sclera: Conjunctivae normal.  Neck:     Musculoskeletal: Normal range of motion. No erythema.     Thyroid: No thyromegaly.     Vascular: No carotid bruit.  Cardiovascular:     Rate and Rhythm: Normal rate and regular rhythm.     Pulses:  Normal pulses.     Heart sounds: Normal heart sounds.  Pulmonary:     Effort: Pulmonary effort is normal. No respiratory distress.     Breath sounds: No wheezing.  Chest:     Breasts:        Right: No mass, nipple discharge, skin change or tenderness.        Left: No mass, nipple discharge, skin change or tenderness.     Comments: Median sternotomy scar healed, mildly tender Abdominal:     General: Bowel sounds are normal.     Palpations: Abdomen is soft.     Tenderness: There is no abdominal tenderness.  Musculoskeletal: Normal range of motion.  Lymphadenopathy:     Cervical: No cervical adenopathy.  Skin:    General: Skin is warm and dry.     Findings: No rash.  Neurological:  Mental Status: She is alert and oriented to person, place, and time.     Cranial Nerves: No cranial nerve deficit.     Sensory: No sensory deficit.     Deep Tendon Reflexes: Reflexes are normal and symmetric.  Psychiatric:        Attention and Perception: Attention normal.        Mood and Affect: Mood normal.        Speech: Speech normal.        Behavior: Behavior normal.        Thought Content: Thought content normal.        Cognition and Memory: Cognition normal.        Judgment: Judgment normal.     Wt Readings from Last 3 Encounters:  02/01/19 133 lb (60.3 kg)  05/03/18 134 lb (60.8 kg)  01/20/18 135 lb (61.2 kg)    BP 134/78   Pulse 78   Ht 5\' 4"  (1.626 m)   Wt 133 lb (60.3 kg)   SpO2 98%   BMI 22.83 kg/m   Assessment and Plan: 1. Anxiety and depression Clinically stable and doing very well on Zoloft 25 mg daily. She has stable weight and appetite, energy level is good. Will continue same dose without change at this time. - TSH  2. Mixed hyperlipidemia Pt is not currently on medication to lower lipids She is working on low fat diet for now She is hesistant to start additional medication at that time Will check labs and advise - Lipid panel  3. Hypertrophic obstructive  cardiomyopathy ([place]) S/p surgical correction and doing very well. AICD in place BP well controlled on metoprolol 12.5 mg daily  4. Paroxysmal atrial fibrillation (HCC) Pt has had no episodes of Afib upon device interrogation in June She is on Eliquis 5 mg bid without bleeding or other side effects - CBC with Differential/Platelet - Comprehensive metabolic panel  5. Encounter for screening mammogram for breast cancer Pt declines mammogram at this time  6. Colon cancer screening She is hesitant to undergo colonoscopy She requests Cologuard - Cologuard  7. B12 nutritional deficiency Hx of B12 deficiency in the past; previously on supplementation but none recently - Vitamin B12  8. Osteoporosis, unspecified osteoporosis type, unspecified pathological fracture presence She is tolerating actonel monthly She is on vitamin D supplementation and high calcium diet Last vitamin D was normal Will check CRP as non specific marker of inflamation - C-reactive protein  Pt is given Rx for shingrix to obtain vaccine at her local pharmacy.  Partially dictated using Editor, commissioning. Any errors are unintentional.  Halina Maidens, MD [facility]  02/01/2019

## 2019-02-02 LAB — CBC WITH DIFFERENTIAL/PLATELET
Basophils Absolute: 0 10*3/uL (ref 0.0–0.2)
Basos: 1 %
EOS (ABSOLUTE): 0 10*3/uL (ref 0.0–0.4)
Eos: 1 %
Hematocrit: 42.6 % (ref 34.0–46.6)
Hemoglobin: 13.9 g/dL (ref 11.1–15.9)
Immature Grans (Abs): 0 10*3/uL (ref 0.0–0.1)
Immature Granulocytes: 0 %
Lymphocytes Absolute: 1 10*3/uL (ref 0.7–3.1)
Lymphs: 18 %
MCH: 31 pg (ref 26.6–33.0)
MCHC: 32.6 g/dL (ref 31.5–35.7)
MCV: 95 fL (ref 79–97)
Monocytes Absolute: 0.4 10*3/uL (ref 0.1–0.9)
Monocytes: 7 %
Neutrophils Absolute: 4.1 10*3/uL (ref 1.4–7.0)
Neutrophils: 73 %
Platelets: 219 10*3/uL (ref 150–450)
RBC: 4.49 x10E6/uL (ref 3.77–5.28)
RDW: 12.7 % (ref 11.7–15.4)
WBC: 5.6 10*3/uL (ref 3.4–10.8)

## 2019-02-02 LAB — LIPID PANEL
Chol/HDL Ratio: 4.2 ratio (ref 0.0–4.4)
Cholesterol, Total: 206 mg/dL — ABNORMAL HIGH (ref 100–199)
HDL: 49 mg/dL (ref >39)
LDL Chol Calc (NIH): 129 mg/dL — ABNORMAL HIGH (ref 0–99)
Triglycerides: 155 mg/dL — ABNORMAL HIGH (ref 0–149)
VLDL Cholesterol Cal: 28 mg/dL (ref 5–40)

## 2019-02-02 LAB — COMPREHENSIVE METABOLIC PANEL
ALT: 20 IU/L (ref 0–32)
AST: 23 IU/L (ref 0–40)
Albumin/Globulin Ratio: 2.4 — ABNORMAL HIGH (ref 1.2–2.2)
Albumin: 4.7 g/dL (ref 3.8–4.8)
Alkaline Phosphatase: 59 IU/L (ref 39–117)
BUN/Creatinine Ratio: 19 (ref 12–28)
BUN: 13 mg/dL (ref 8–27)
Bilirubin Total: 0.6 mg/dL (ref 0.0–1.2)
CO2: 23 mmol/L (ref 20–29)
Calcium: 9.4 mg/dL (ref 8.7–10.3)
Chloride: 105 mmol/L (ref 96–106)
Creatinine, Ser: 0.69 mg/dL (ref 0.57–1.00)
GFR calc Af Amer: 106 mL/min/{1.73_m2} (ref >59)
GFR calc non Af Amer: 92 mL/min/{1.73_m2} (ref >59)
Globulin, Total: 2 g/dL (ref 1.5–4.5)
Glucose: 89 mg/dL (ref 65–99)
Potassium: 5.2 mmol/L (ref 3.5–5.2)
Sodium: 143 mmol/L (ref 134–144)
Total Protein: 6.7 g/dL (ref 6.0–8.5)

## 2019-02-02 LAB — TSH: TSH: 0.539 u[IU]/mL (ref 0.450–4.500)

## 2019-02-02 LAB — VITAMIN B12: Vitamin B-12: 401 pg/mL (ref 232–1245)

## 2019-02-02 LAB — C-REACTIVE PROTEIN: CRP: 2 mg/L (ref 0–10)

## 2019-04-04 ENCOUNTER — Other Ambulatory Visit: Payer: Self-pay | Admitting: Internal Medicine

## 2019-06-05 ENCOUNTER — Ambulatory Visit: Payer: Medicare Other

## 2019-06-12 ENCOUNTER — Ambulatory Visit (INDEPENDENT_AMBULATORY_CARE_PROVIDER_SITE_OTHER): Payer: Medicare Other

## 2019-06-12 ENCOUNTER — Other Ambulatory Visit: Payer: Self-pay

## 2019-06-12 DIAGNOSIS — Z23 Encounter for immunization: Secondary | ICD-10-CM | POA: Diagnosis not present

## 2019-11-28 ENCOUNTER — Ambulatory Visit (INDEPENDENT_AMBULATORY_CARE_PROVIDER_SITE_OTHER): Payer: Medicare Other

## 2019-11-28 DIAGNOSIS — Z0000 Encounter for general adult medical examination without abnormal findings: Secondary | ICD-10-CM | POA: Diagnosis not present

## 2019-11-28 NOTE — Progress Notes (Signed)
Subjective:   Carmen Graves is a 64 y.o. female who presents for an Initial Medicare Annual Wellness Visit.  Virtual Visit via Telephone Note  I connected with  Carmen Graves on 11/28/19 at  1:20 PM EDT by telephone and verified that I am speaking with the correct person using two identifiers.  Medicare Annual Wellness visit completed telephonically due to Covid-19 pandemic.   Location: Patient: home Provider: office   I discussed the limitations, risks, security and privacy concerns of performing an evaluation and management service by telephone and the availability of in person appointments. The patient expressed understanding and agreed to proceed.  Unable to perform video visit due to video visit attempted and failed and/or patient does not have video capability.   Some vital signs may be absent or patient reported.   Carmen Marker, LPN    Review of Systems     Cardiac Risk Factors include: advanced age (>88men, >72 women);hypertension     Objective:    There were no vitals filed for this visit. There is no height or weight on file to calculate BMI.  Advanced Directives 11/28/2019 01/15/2016 07/18/2015  Does Patient Have a Medical Advance Directive? No No No  Does patient want to make changes to medical advance directive? No - Patient declined - -  Would patient like information on creating a medical advance directive? - Yes - Educational materials given No - patient declined information    Current Medications (verified) Outpatient Encounter Medications as of 11/28/2019  Medication Sig  . acetaminophen (TYLENOL) 650 MG CR tablet Take by mouth.  Marland Kitchen apixaban (ELIQUIS) 5 MG TABS tablet Take 5 mg by mouth 2 (two) times daily.   Carmen Graves Serrata (BOSWELLIA PO) Take by mouth. Pt takes 3 in am and 3 in pm  . Cholecalciferol (VITAMIN D3) 50 MCG (2000 UT) capsule Take 1 capsule by mouth daily. 2000 units  . diltiazem (CARDIZEM) 30 MG tablet Take 30 mg by mouth as needed.    . Glucosamine 500 MG CAPS Take by mouth. Pt takes 1000 mg BID  . metoprolol succinate (TOPROL-XL) 25 MG 24 hr tablet Take 25 mg by mouth daily. Pt taking 1/2 tablet daily  . risedronate (ACTONEL) 150 MG tablet Take 150 mg by mouth every 30 (thirty) days. with water on empty stomach, nothing by mouth or lie down for next 30 minutes.  . sertraline (ZOLOFT) 25 MG tablet TAKE 1 TABLET BY MOUTH EVERY DAY  . vitamin C (ASCORBIC ACID) 500 MG tablet Take 500 mg by mouth daily.  . vitamin E 180 MG (400 UNITS) capsule Take by mouth.  Marland Kitchen XIIDRA 5 % SOLN Place 1 drop into both eyes in the morning and at bedtime.  . [DISCONTINUED] ferrous sulfate (SLOW FE) 160 (50 Fe) MG TBCR SR tablet Take 1 tablet by mouth. Monday Wednesday and Fridays  . [DISCONTINUED] metoprolol tartrate (LOPRESSOR) 25 MG tablet Take 12.5 mg by mouth daily.    No facility-administered encounter medications on file as of 11/28/2019.    Allergies (verified) Albuterol, Budesonide-formoterol fumarate, Fluticasone-salmeterol, Ipratropium-albuterol, Celecoxib, Codeine, Nsaids, Penicillins, and Prednisone   History: Past Medical History:  Diagnosis Date  . A-fib ([place])   . Asthma   . CHF (congestive heart failure), NYHA class III, chronic, diastolic ([place]) 75/64/3329  . Hyperlipidemia   . Hypertrophic obstructive cardiomyopathy ([place])   . Neurocardiogenic syncope 02/23/2015  . Osteoarthrosis   . Osteopenia   . Palpitations   . Presence of automatic cardioverter/defibrillator (  AICD) 03/13/2015  . Renal insufficiency   . Status post cardiac surgery 03/31/2016   Ventriculomyomectomy, MV revision  . Status post cardiac surgery 04/08/2016  . Vitamin B deficiency    Past Surgical History:  Procedure Laterality Date  . BONY PELVIS SURGERY     tore muscle from pelvic bone  . BREAST BIOPSY Bilateral 15+ YRS AGO   NEG  . BREAST CYST ASPIRATION Bilateral   . CARDIAC DEFIBRILLATOR PLACEMENT  01/2015   for HOCM  . MYOMECTOMY  06/2015  .  TONSILLECTOMY AND ADENOIDECTOMY    . UMBILICAL HERNIA REPAIR     Family History  Problem Relation Age of Onset  . Muscular dystrophy Mother   . Atrial fibrillation Other    Social History   Socioeconomic History  . Marital status: Married    Spouse name: Not on file  . Number of children: 2  . Years of education: Not on file  . Highest education level: Not on file  Occupational History  . Occupation: retired  Tobacco Use  . Smoking status: Never Smoker  . Smokeless tobacco: Never Used  Vaping Use  . Vaping Use: Never used  Substance and Sexual Activity  . Alcohol use: No    Alcohol/week: 0.0 standard drinks  . Drug use: No  . Sexual activity: Not on file  Other Topics Concern  . Not on file  Social History Narrative   Pt follows low sodium diet, no caffeine, avoids sugar.       2 daughters, both married   Social Determinants of Health   Financial Resource Strain: Low Risk   . Difficulty of Paying Living Expenses: Not hard at all  Food Insecurity: No Food Insecurity  . Worried About Charity fundraiser in the Last Year: Never true  . Ran Out of Food in the Last Year: Never true  Transportation Needs: No Transportation Needs  . Lack of Transportation (Medical): No  . Lack of Transportation (Non-Medical): No  Physical Activity: Sufficiently Active  . Days of Exercise per Week: 7 days  . Minutes of Exercise per Session: 30 min  Stress: No Stress Concern Present  . Feeling of Stress : Only a little  Social Connections: Unknown  . Frequency of Communication with Friends and Family: Not on file  . Frequency of Social Gatherings with Friends and Family: Not on file  . Attends Religious Services: Not on file  . Active Member of Clubs or Organizations: Not on file  . Attends Archivist Meetings: Not on file  . Marital Status: Married    Tobacco Counseling Counseling given: Not Answered   Clinical Intake:  Pre-visit preparation completed: Yes  Pain :  No/denies pain     Nutritional Risks: None Diabetes: No  How often do you need to have someone help you when you read instructions, pamphlets, or other written materials from your doctor or pharmacy?: 1 - Never    Interpreter Needed?: No  Information entered by :: Carmen Marker LPN   Activities of Daily Living In your present state of health, do you have any difficulty performing the following activities: 11/28/2019  Hearing? N  Comment declines hearing aids  Vision? N  Difficulty concentrating or making decisions? N  Walking or climbing stairs? N  Dressing or bathing? N  Doing errands, shopping? N  Preparing Food and eating ? N  Using the Toilet? N  In the past six months, have you accidently leaked urine? N  Do you have  problems with loss of bowel control? N  Managing your Medications? N  Managing your Finances? N  Housekeeping or managing your Housekeeping? N  Some recent data might be hidden    Patient Care Team: Glean Hess, MD as PCP - General (Internal Medicine) Gale Journey Gwen Her, MD as Referring Physician (Endocrinology) Derinda Sis, MD as Referring Physician (Cardiology) Glower, Arliss Journey, MD as Referring Physician (Cardiothoracic Surgery) Dr Joetta Manners  Indicate any recent Medical Services you may have received from other than [facility] providers in the past year (date may be approximate).     Assessment:   This is a routine wellness examination for Grady.  Hearing/Vision screen  Hearing Screening   125Hz  250Hz  500Hz  1000Hz  2000Hz  3000Hz  4000Hz  6000Hz  8000Hz   Right ear:           Left ear:           Comments: Pt denies hearing difficulty  Vision Screening Comments: [facility]  Dietary issues and exercise activities discussed: Current Exercise Habits: Home exercise routine, Type of exercise: walking;Other - see comments;yoga, Time (Minutes): 30, Frequency (Times/Week): 7, Weekly Exercise (Minutes/Week): 210,  Intensity: Moderate, Exercise limited by: cardiac condition(s)  Goals   None    Depression Screen PHQ 2/9 Scores 11/28/2019 02/01/2019 05/03/2018 01/20/2018 01/20/2017  PHQ - 2 Score 0 0 0 0 0  PHQ- 9 Score - 1 0 1 0    Fall Risk Fall Risk  11/28/2019 02/01/2019  Falls in the past year? 0 0  Number falls in past yr: 0 0  Injury with Fall? 0 0  Risk for fall due to : No Fall Risks -  Follow up Falls prevention discussed Falls evaluation completed;Education provided    Any stairs in or around the home? Yes  If so, are there any without handrails? Yes  Home free of loose throw rugs in walkways, pet beds, electrical cords, etc? Yes  Adequate lighting in your home to reduce risk of falls? Yes   ASSISTIVE DEVICES UTILIZED TO PREVENT FALLS:  Life alert? No  Use of a cane, walker or w/c? No  Grab bars in the bathroom? Yes  Shower chair or bench in shower? No  Elevated toilet seat or a handicapped toilet? No   TIMED UP AND GO:  Was the test performed? No . Telephonic visit.   Cognitive Function: pt declined 6CIT for 2021 AWV; pt has no memory issues        Immunizations Immunization History  Administered Date(s) Administered  . Fluad Quad(high Dose 65+) 02/01/2019  . Influenza,inj,Quad PF,6+ Mos 05/02/2015, 03/15/2016, 01/20/2017, 01/20/2018  . Influenza-Unspecified 05/05/2015  . PFIZER SARS-COV-2 Vaccination 07/11/2019, 08/01/2019  . Pneumococcal Conjugate-13 01/15/2016  . Pneumococcal Polysaccharide-23 06/13/2014, 06/12/2019  . Pneumococcal-Unspecified 06/13/2014  . Tdap 11/22/2014    TDAP status: Up to date   Flu Vaccine status: Up to date   Pneumococcal vaccine status: Up to date   Covid-19 vaccine status: Completed vaccines  Qualifies for Shingles Vaccine? Yes   Zostavax completed No   Shingrix Completed?: No.    Education has been provided regarding the importance of this vaccine. Patient has been advised to call insurance company to determine out of pocket  expense if they have not yet received this vaccine. Advised may also receive vaccine at local pharmacy or Health Dept. Verbalized acceptance and understanding.  Screening Tests Health Maintenance  Topic Date Due  . COLONOSCOPY  Never done  . MAMMOGRAM  03/17/2017  .  INFLUENZA VACCINE  12/30/2019  . TETANUS/TDAP  11/21/2024  . DEXA SCAN  02/03/2028  . COVID-19 Vaccine  Completed  . Hepatitis C Screening  Completed  . PNA vac Low Risk Adult  Completed    Health Maintenance  Health Maintenance Due  Topic Date Due  . COLONOSCOPY  Never done  . MAMMOGRAM  03/17/2017   Colorectal Cancer Screening: pt declines referral to GI for screening colonoscopy at this time.   Mammogram status: Completed 03/22/16. Repeat every year. Pt declines repeat screening at this time due to hx of open heart surgery; pt reports she does complete SBE regularly.   Bone Density status: Completed 02/02/18. Results reflect: Bone density results: OSTEOPOROSIS. Repeat every 2 years. Ordered by Dr. Gale Journey at [facility].   Lung Cancer Screening: (Low Dose CT Chest recommended if Age 71-80 years, 30 pack-year currently smoking OR have quit w/in 15years.) does not qualify.   Additional Screening:  Hepatitis C Screening: does qualify; Completed 01/15/16  Vision Screening: Recommended annual ophthalmology exams for early detection of glaucoma and other disorders of the eye. Is the patient up to date with their annual eye exam?  Yes  Who is the provider or what is the name of the office in which the patient attends annual eye exams? [place]  Dental Screening: Recommended annual dental exams for proper oral hygiene  Community Resource Referral / Chronic Care Management: CRR required this visit?  No   CCM required this visit?  No      Plan:     I have personally reviewed and noted the following in the patient's chart:   . Medical and social history . Use of alcohol, tobacco or illicit drugs  . Current  medications and supplements . Functional ability and status . Nutritional status . Physical activity . Advanced directives . List of other physicians . Hospitalizations, surgeries, and ER visits in previous 12 months . Vitals . Screenings to include cognitive, depression, and falls . Referrals and appointments  In addition, I have reviewed and discussed with patient certain preventive protocols, quality metrics, and best practice recommendations. A written personalized care plan for preventive services as well as general preventive health recommendations were provided to patient.     Carmen Marker, LPN   09/07/7351   Nurse Notes: none

## 2019-11-28 NOTE — Patient Instructions (Signed)
Carmen Graves , Thank you for taking time to come for your Medicare Wellness Visit. I appreciate your ongoing commitment to your health goals. Please review the following plan we discussed and let me know if I can assist you in the future.   Screening recommendations/referrals: Colonoscopy: DUE. Please contact us if you would like a referral to gastroenterology for a screening colonoscopy.  Mammogram: done 03/22/16.  Bone Density: done 02/02/18 Recommended yearly ophthalmology/optometry visit for glaucoma screening and checkup Recommended yearly dental visit for hygiene and checkup  Vaccinations: Influenza vaccine: done 02/01/19 Pneumococcal vaccine: done 06/12/19 Tdap vaccine: done 11/22/14 Shingles vaccine: Shingrix discussed. Please contact your pharmacy for coverage information.  Covid-19:done 07/11/19 & 08/01/19  Advanced directives: Advance directive discussed with you today. Even though you declined this today please call our office should you change your mind and we can give you the proper paperwork for you to fill out.  Conditions/risks identified: Keep up the great work!  Next appointment: Follow up in one year for your annual wellness visit    Preventive Care 65 Years and Older, Female Preventive care refers to lifestyle choices and visits with your health care provider that can promote health and wellness. What does preventive care include?  A yearly physical exam. This is also called an annual well check.  Dental exams once or twice a year.  Routine eye exams. Ask your health care provider how often you should have your eyes checked.  Personal lifestyle choices, including:  Daily care of your teeth and gums.  Regular physical activity.  Eating a healthy diet.  Avoiding tobacco and drug use.  Limiting alcohol use.  Practicing safe sex.  Taking low-dose aspirin every day.  Taking vitamin and mineral supplements as recommended by your health care provider. What happens  during an annual well check? The services and screenings done by your health care provider during your annual well check will depend on your age, overall health, lifestyle risk factors, and family history of disease. Counseling  Your health care provider may ask you questions about your:  Alcohol use.  Tobacco use.  Drug use.  Emotional well-being.  Home and relationship well-being.  Sexual activity.  Eating habits.  History of falls.  Memory and ability to understand (cognition).  Work and work Statistician.  Reproductive health. Screening  You may have the following tests or measurements:  Height, weight, and BMI.  Blood pressure.  Lipid and cholesterol levels. These may be checked every 5 years, or more frequently if you are over 81 years old.  Skin check.  Lung cancer screening. You may have this screening every year starting at age 9 if you have a 30-pack-year history of smoking and currently smoke or have quit within the past 15 years.  Fecal occult blood test (FOBT) of the stool. You may have this test every year starting at age 36.  Flexible sigmoidoscopy or colonoscopy. You may have a sigmoidoscopy every 5 years or a colonoscopy every 10 years starting at age 32.  Hepatitis C blood test.  Hepatitis B blood test.  Sexually transmitted disease (STD) testing.  Diabetes screening. This is done by checking your blood sugar (glucose) after you have not eaten for a while (fasting). You may have this done every 1-3 years.  Bone density scan. This is done to screen for osteoporosis. You may have this done starting at age 71.  Mammogram. This may be done every 1-2 years. Talk to your health care provider about how often  you should have regular mammograms. Talk with your health care provider about your test results, treatment options, and if necessary, the need for more tests. Vaccines  Your health care provider may recommend certain vaccines, such  as:  Influenza vaccine. This is recommended every year.  Tetanus, diphtheria, and acellular pertussis (Tdap, Td) vaccine. You may need a Td booster every 10 years.  Zoster vaccine. You may need this after age 62.  Pneumococcal 13-valent conjugate (PCV13) vaccine. One dose is recommended after age 4.  Pneumococcal polysaccharide (PPSV23) vaccine. One dose is recommended after age 79. Talk to your health care provider about which screenings and vaccines you need and how often you need them. This information is not intended to replace advice given to you by your health care provider. Make sure you discuss any questions you have with your health care provider. Document Released: 06/13/2015 Document Revised: 02/04/2016 Document Reviewed: 03/18/2015 Elsevier Interactive Patient Education  2017 [place] Prevention in the Home Falls can cause injuries. They can happen to people of all ages. There are many things you can do to make your home safe and to help prevent falls. What can I do on the outside of my home?  Regularly fix the edges of walkways and driveways and fix any cracks.  Remove anything that might make you trip as you walk through a door, such as a raised step or threshold.  Trim any bushes or trees on the path to your home.  Use bright outdoor lighting.  Clear any walking paths of anything that might make someone trip, such as rocks or tools.  Regularly check to see if handrails are loose or broken. Make sure that both sides of any steps have handrails.  Any raised decks and porches should have guardrails on the edges.  Have any leaves, snow, or ice cleared regularly.  Use sand or salt on walking paths during winter.  Clean up any spills in your garage right away. This includes oil or grease spills. What can I do in the bathroom?  Use night lights.  Install grab bars by the toilet and in the tub and shower. Do not use towel bars as grab bars.  Use  non-skid mats or decals in the tub or shower.  If you need to sit down in the shower, use a plastic, non-slip stool.  Keep the floor dry. Clean up any water that spills on the floor as soon as it happens.  Remove soap buildup in the tub or shower regularly.  Attach bath mats securely with double-sided non-slip rug tape.  Do not have throw rugs and other things on the floor that can make you trip. What can I do in the bedroom?  Use night lights.  Make sure that you have a light by your bed that is easy to reach.  Do not use any sheets or blankets that are too big for your bed. They should not hang down onto the floor.  Have a firm chair that has side arms. You can use this for support while you get dressed.  Do not have throw rugs and other things on the floor that can make you trip. What can I do in the kitchen?  Clean up any spills right away.  Avoid walking on wet floors.  Keep items that you use a lot in easy-to-reach places.  If you need to reach something above you, use a strong step stool that has a grab bar.  Keep electrical cords  out of the way.  Do not use floor polish or wax that makes floors slippery. If you must use wax, use non-skid floor wax.  Do not have throw rugs and other things on the floor that can make you trip. What can I do with my stairs?  Do not leave any items on the stairs.  Make sure that there are handrails on both sides of the stairs and use them. Fix handrails that are broken or loose. Make sure that handrails are as long as the stairways.  Check any carpeting to make sure that it is firmly attached to the stairs. Fix any carpet that is loose or worn.  Avoid having throw rugs at the top or bottom of the stairs. If you do have throw rugs, attach them to the floor with carpet tape.  Make sure that you have a light switch at the top of the stairs and the bottom of the stairs. If you do not have them, ask someone to add them for you. What  else can I do to help prevent falls?  Wear shoes that:  Do not have high heels.  Have rubber bottoms.  Are comfortable and fit you well.  Are closed at the toe. Do not wear sandals.  If you use a stepladder:  Make sure that it is fully opened. Do not climb a closed stepladder.  Make sure that both sides of the stepladder are locked into place.  Ask someone to hold it for you, if possible.  Clearly mark and make sure that you can see:  Any grab bars or handrails.  First and last steps.  Where the edge of each step is.  Use tools that help you move around (mobility aids) if they are needed. These include:  Canes.  Walkers.  Scooters.  Crutches.  Turn on the lights when you go into a dark area. Replace any light bulbs as soon as they burn out.  Set up your furniture so you have a clear path. Avoid moving your furniture around.  If any of your floors are uneven, fix them.  If there are any pets around you, be aware of where they are.  Review your medicines with your doctor. Some medicines can make you feel dizzy. This can increase your chance of falling. Ask your doctor what other things that you can do to help prevent falls. This information is not intended to replace advice given to you by your health care provider. Make sure you discuss any questions you have with your health care provider. Document Released: 03/13/2009 Document Revised: 10/23/2015 Document Reviewed: 06/21/2014 Elsevier Interactive Patient Education  2017 [employer].

## 2020-01-09 LAB — HM DEXA SCAN

## 2020-01-14 ENCOUNTER — Other Ambulatory Visit: Payer: Self-pay | Admitting: Internal Medicine

## 2020-01-14 NOTE — Telephone Encounter (Signed)
Medication Refill - Medication: sertraline (ZOLOFT) 25 MG tablet [[id number]    Preferred Pharmacy (with phone number or street name):  [id number] [address], [place]  [place] [address]  Phone: [phone] Fax: [phone]  Hours: Not open 24 hours     Agent: Please be advised that RX refills may take up to 3 business days. We ask that you follow-up with your pharmacy.

## 2020-02-06 ENCOUNTER — Encounter: Payer: Self-pay | Admitting: Internal Medicine

## 2020-02-06 ENCOUNTER — Ambulatory Visit (INDEPENDENT_AMBULATORY_CARE_PROVIDER_SITE_OTHER): Payer: Medicare Other | Admitting: Internal Medicine

## 2020-02-06 ENCOUNTER — Other Ambulatory Visit: Payer: Self-pay

## 2020-02-06 VITALS — BP 112/74 | HR 64 | Temp 97.6°F | Ht 64.0 in | Wt 137.0 lb

## 2020-02-06 DIAGNOSIS — I480 Paroxysmal atrial fibrillation: Secondary | ICD-10-CM

## 2020-02-06 DIAGNOSIS — F329 Major depressive disorder, single episode, unspecified: Secondary | ICD-10-CM

## 2020-02-06 DIAGNOSIS — Z1231 Encounter for screening mammogram for malignant neoplasm of breast: Secondary | ICD-10-CM | POA: Diagnosis not present

## 2020-02-06 DIAGNOSIS — E785 Hyperlipidemia, unspecified: Secondary | ICD-10-CM

## 2020-02-06 DIAGNOSIS — F419 Anxiety disorder, unspecified: Secondary | ICD-10-CM | POA: Diagnosis not present

## 2020-02-06 DIAGNOSIS — Z23 Encounter for immunization: Secondary | ICD-10-CM | POA: Diagnosis not present

## 2020-02-06 DIAGNOSIS — I421 Obstructive hypertrophic cardiomyopathy: Secondary | ICD-10-CM | POA: Diagnosis not present

## 2020-02-06 DIAGNOSIS — Z1211 Encounter for screening for malignant neoplasm of colon: Secondary | ICD-10-CM

## 2020-02-06 DIAGNOSIS — F32A Depression, unspecified: Secondary | ICD-10-CM

## 2020-02-06 DIAGNOSIS — E538 Deficiency of other specified B group vitamins: Secondary | ICD-10-CM

## 2020-02-06 DIAGNOSIS — M810 Age-related osteoporosis without current pathological fracture: Secondary | ICD-10-CM

## 2020-02-06 MED ORDER — SHINGRIX 50 MCG/0.5ML IM SUSR: 0.5000 mL | Freq: Once | INTRAMUSCULAR | 1 refills | Status: AC

## 2020-02-06 MED ORDER — SERTRALINE HCL 25 MG PO TABS: 25.0000 mg | ORAL_TABLET | Freq: Every day | ORAL | 3 refills | Status: DC

## 2020-02-06 NOTE — Progress Notes (Signed)
Date:  02/06/2020   Name:  Carmen Graves   DOB:  01/14/1956   MRN:  [id number]   Chief Complaint: Annual Exam (flu shot/ no breast exam(pt declined she has a obgynand she has a appt in Nov) no pap) and Colon Cancer Screening (does not want to do colonoscopy want to do FIT or cologuard )  Carmen Graves is a 64 y.o. female who presents today for her Complete Annual Exam. She feels well. She reports exercising walking/yoga  x7 days a week. She reports she is sleeping well. Breast complaints none.  Mammogram: 02/2016 DEXA: 12/2019 osteoporosis Pap smear: sees GYN Colonoscopy: none - cologuard x 2 not done  Immunization History  Administered Date(s) Administered  . Fluad Quad(high Dose 65+) 02/01/2019  . Influenza,inj,Quad PF,6+ Mos 05/02/2015, 03/15/2016, 01/20/2017, 01/20/2018  . Influenza-Unspecified 05/05/2015  . PFIZER SARS-COV-2 Vaccination 07/11/2019, 08/01/2019  . Pneumococcal Conjugate-13 01/15/2016  . Pneumococcal Polysaccharide-23 06/13/2014, 06/12/2019  . Pneumococcal-Unspecified 06/13/2014  . Tdap 11/22/2014    Depression        This is a chronic problem.The problem is unchanged.  Associated symptoms include no fatigue and no headaches.  Past treatments include SSRIs - Selective serotonin reuptake inhibitors.  Compliance with treatment is good.  Previous treatment provided significant relief. paroxysmal Atrial fibrillation - followed by cardiology.  On metoprolol and Eliquis.  No bleeding issues noted. OP - followed at [facility] by Endo.  Recent DEXA done. On Actonel, calcium and vitamin D. No recent vitamin D level done   Lab Results  Component Value Date   CREATININE 0.69 02/01/2019   BUN 13 02/01/2019   NA 143 02/01/2019   K 5.2 02/01/2019   CL 105 02/01/2019   CO2 23 02/01/2019   Lab Results  Component Value Date   CHOL 206 (H) 02/01/2019   HDL 49 02/01/2019   LDLCALC 129 (H) 02/01/2019   TRIG 155 (H) 02/01/2019   CHOLHDL 4.2 02/01/2019   Lab Results   Component Value Date   TSH 0.539 02/01/2019   No results found for: HGBA1C Lab Results  Component Value Date   WBC 5.6 02/01/2019   HGB 13.9 02/01/2019   HCT 42.6 02/01/2019   MCV 95 02/01/2019   PLT 219 02/01/2019   Lab Results  Component Value Date   ALT 20 02/01/2019   AST 23 02/01/2019   ALKPHOS 59 02/01/2019   BILITOT 0.6 02/01/2019     Review of Systems  Constitutional: Negative for chills, fatigue and fever.  HENT: Negative for congestion, hearing loss, tinnitus, trouble swallowing and voice change.   Eyes: Negative for visual disturbance.  Respiratory: Negative for cough, chest tightness, shortness of breath and wheezing.   Cardiovascular: Negative for chest pain, palpitations and leg swelling.  Gastrointestinal: Negative for abdominal pain, constipation, diarrhea and vomiting.  Endocrine: Negative for polydipsia and polyuria.  Genitourinary: Negative for dysuria, frequency, genital sores, vaginal bleeding and vaginal discharge.  Musculoskeletal: Negative for arthralgias, gait problem and joint swelling.  Skin: Negative for color change and rash.  Neurological: Negative for dizziness, tremors, light-headedness and headaches.  Hematological: Negative for adenopathy. Does not bruise/bleed easily.  Psychiatric/Behavioral: Positive for depression. Negative for dysphoric mood and sleep disturbance. The patient is not nervous/anxious.     Patient Active Problem List   Diagnosis Date Noted  . Need for prophylactic antibiotic 04/08/2016  . Paroxysmal atrial fibrillation (Carmen Graves) 06/20/2015  . Abnormal genetic test 05/08/2015  . Presence of automatic cardioverter/defibrillator (AICD) 03/13/2015  .  Anxiety and depression 03/07/2015  . Hyperlipidemia, mild 03/05/2015  . Osteoporosis 03/05/2015  . Hypertrophic obstructive cardiomyopathy (Carmen Graves) 02/27/2015    Allergies  Allergen Reactions  . Albuterol Shortness Of Breath    Chest pain and SOB  . Budesonide-Formoterol  Fumarate Shortness Of Breath    Chest pain, severe SOB   . Fluticasone-Salmeterol Shortness Of Breath  . Ipratropium-Albuterol Shortness Of Breath    Chest pain  . Celecoxib     Other reaction(s): Kidney Disorder  . Codeine Nausea Only  . Nsaids Other (See Comments)    Causes kidney disease   . Penicillins   . Prednisone Other (See Comments)    Chest pain and tightness    Past Surgical History:  Procedure Laterality Date  . BONY PELVIS SURGERY     tore muscle from pelvic bone  . BREAST BIOPSY Bilateral 15+ YRS AGO   NEG  . BREAST CYST ASPIRATION Bilateral   . CARDIAC DEFIBRILLATOR PLACEMENT  01/2015   for HOCM  . MYOMECTOMY  06/2015  . TONSILLECTOMY AND ADENOIDECTOMY    . UMBILICAL HERNIA REPAIR      Social History   Tobacco Use  . Smoking status: Never Smoker  . Smokeless tobacco: Never Used  Vaping Use  . Vaping Use: Never used  Substance Use Topics  . Alcohol use: No    Alcohol/week: 0.0 standard drinks  . Drug use: No     Medication list has been reviewed and updated.  Current Meds  Medication Sig  . acetaminophen (TYLENOL) 650 MG CR tablet Take by mouth.  Marland Kitchen apixaban (ELIQUIS) 5 MG TABS tablet Take 5 mg by mouth 2 (two) times daily.   Carmen Graves Serrata (BOSWELLIA PO) Take by mouth. Pt takes 3 in am and 3 in pm  . calcium citrate-vitamin D (CITRACAL+D) 315-200 MG-UNIT tablet Take 1 tablet by mouth daily.  . Cholecalciferol (VITAMIN D3) 50 MCG (2000 UT) capsule Take 1 capsule by mouth daily. 2000 units  . diltiazem (CARDIZEM) 30 MG tablet Take 30 mg by mouth as needed.  . Glucosamine 500 MG CAPS Take by mouth. Pt takes 1000 mg BID  . MAGNESIUM PO Take by mouth.  . metoprolol succinate (TOPROL-XL) 25 MG 24 hr tablet Take 25 mg by mouth daily. Pt taking 1/2 tablet daily  . risedronate (ACTONEL) 150 MG tablet Take 150 mg by mouth every 30 (thirty) days. with water on empty stomach, nothing by mouth or lie down for next 30 minutes. For bone density  .  S-Adenosylmethionine (SAM-E PO) Take by mouth. For arthritis  . sertraline (ZOLOFT) 25 MG tablet TAKE 1 TABLET BY MOUTH EVERY DAY  . vitamin C (ASCORBIC ACID) 500 MG tablet Take 500 mg by mouth daily.  Marland Kitchen XIIDRA 5 % SOLN Place 1 drop into both eyes in the morning and at bedtime.  . [DISCONTINUED] vitamin E 180 MG (400 UNITS) capsule Take by mouth.    PHQ 2/9 Scores 11/28/2019 02/01/2019 05/03/2018 01/20/2018  PHQ - 2 Score 0 0 0 0  PHQ- 9 Score - 1 0 1    No flowsheet data found.  BP Readings from Last 3 Encounters:  02/06/20 112/74  02/01/19 134/78  05/03/18 102/60    Physical Exam Vitals and nursing note reviewed.  Constitutional:      General: She is not in acute distress.    Appearance: She is well-developed.  HENT:     Head: Normocephalic and atraumatic.     Right Ear: Tympanic membrane and  ear canal normal.     Left Ear: Tympanic membrane and ear canal normal.     Nose:     Right Sinus: No maxillary sinus tenderness.     Left Sinus: No maxillary sinus tenderness.  Eyes:     General: No scleral icterus.       Right eye: No discharge.        Left eye: No discharge.     Conjunctiva/sclera: Conjunctivae normal.  Neck:     Thyroid: No thyromegaly.     Vascular: No carotid bruit.  Cardiovascular:     Rate and Rhythm: Normal rate and regular rhythm.     Pulses: Normal pulses.     Heart sounds: Normal heart sounds.  Pulmonary:     Effort: Pulmonary effort is normal. No respiratory distress.     Breath sounds: No wheezing.  Abdominal:     General: Bowel sounds are normal.     Palpations: Abdomen is soft.     Tenderness: There is no abdominal tenderness.  Musculoskeletal:     Cervical back: Normal range of motion. No erythema.     Right lower leg: No edema.     Left lower leg: No edema.  Lymphadenopathy:     Cervical: No cervical adenopathy.  Skin:    General: Skin is warm and dry.     Findings: No rash.  Neurological:     Mental Status: She is alert and oriented to  person, place, and time.     Cranial Nerves: No cranial nerve deficit.     Sensory: No sensory deficit.     Deep Tendon Reflexes: Reflexes are normal and symmetric.  Psychiatric:        Attention and Perception: Attention normal.        Mood and Affect: Mood normal.     Wt Readings from Last 3 Encounters:  02/06/20 137 lb (62.1 kg)  02/01/19 133 lb (60.3 kg)  05/03/18 134 lb (60.8 kg)    BP 112/74   Pulse 64   Temp 97.6 F (36.4 C) (Oral)   Ht 5\' 4"  (1.626 m)   Wt 137 lb (62.1 kg)   SpO2 97%   BMI 23.52 kg/m   Assessment and Plan: 1. Anxiety and depression Clinically stable on current regimen with good control of symptoms, No SI or HI. Will continue current therapy. - TSH - sertraline (ZOLOFT) 25 MG tablet; Take 1 tablet (25 mg total) by mouth daily.  Dispense: 90 tablet; Refill: 3  2. Encounter for screening mammogram for breast cancer Pt will consider a mammogram - she denies breast issues and no family hx of breast cancer - MM 3D SCREEN BREAST BILATERAL; Future  3. Colon cancer screening Encouraged to submit this as soon as possible after it arrives - Cologuard  4. Hypertrophic obstructive cardiomyopathy ([place]) Followed by cardiology s/p surgery On metoprolol for cardioprotection  5. Paroxysmal atrial fibrillation (HCC) On DOAC without bleeding issues at this time - CBC with Differential/Platelet - Comprehensive metabolic panel  6. Hyperlipidemia, mild Check labs and advise on medication - Lipid panel  7. Osteoporosis, unspecified osteoporosis type, unspecified pathological fracture presence Followed by Endo at [facility] - Magnesium - VITAMIN D 25 Hydroxy (Vit-D Deficiency, Fractures)  8. B12 nutritional deficiency - Vitamin B12   Partially dictated using Editor, commissioning. Any errors are unintentional.  Halina Maidens, MD [facility]  02/06/2020

## 2020-02-07 LAB — CBC WITH DIFFERENTIAL/PLATELET
Basophils Absolute: 0.1 10*3/uL (ref 0.0–0.2)
Basos: 1 %
EOS (ABSOLUTE): 0 10*3/uL (ref 0.0–0.4)
Eos: 1 %
Hematocrit: 41.7 % (ref 34.0–46.6)
Hemoglobin: 13.8 g/dL (ref 11.1–15.9)
Immature Grans (Abs): 0 10*3/uL (ref 0.0–0.1)
Immature Granulocytes: 0 %
Lymphocytes Absolute: 1.5 10*3/uL (ref 0.7–3.1)
Lymphs: 23 %
MCH: 30.8 pg (ref 26.6–33.0)
MCHC: 33.1 g/dL (ref 31.5–35.7)
MCV: 93 fL (ref 79–97)
Monocytes Absolute: 0.4 10*3/uL (ref 0.1–0.9)
Monocytes: 7 %
Neutrophils Absolute: 4.4 10*3/uL (ref 1.4–7.0)
Neutrophils: 68 %
Platelets: 200 10*3/uL (ref 150–450)
RBC: 4.48 x10E6/uL (ref 3.77–5.28)
RDW: 12.3 % (ref 11.7–15.4)
WBC: 6.5 10*3/uL (ref 3.4–10.8)

## 2020-02-07 LAB — TSH: TSH: 0.761 u[IU]/mL (ref 0.450–4.500)

## 2020-02-07 LAB — COMPREHENSIVE METABOLIC PANEL
ALT: 19 IU/L (ref 0–32)
AST: 20 IU/L (ref 0–40)
Albumin/Globulin Ratio: 2.1 (ref 1.2–2.2)
Albumin: 4.6 g/dL (ref 3.8–4.8)
Alkaline Phosphatase: 64 IU/L (ref 48–121)
BUN/Creatinine Ratio: 25 (ref 12–28)
BUN: 16 mg/dL (ref 8–27)
Bilirubin Total: 0.6 mg/dL (ref 0.0–1.2)
CO2: 23 mmol/L (ref 20–29)
Calcium: 9 mg/dL (ref 8.7–10.3)
Chloride: 103 mmol/L (ref 96–106)
Creatinine, Ser: 0.64 mg/dL (ref 0.57–1.00)
GFR calc Af Amer: 108 mL/min/{1.73_m2} (ref >59)
GFR calc non Af Amer: 93 mL/min/{1.73_m2} (ref >59)
Globulin, Total: 2.2 g/dL (ref 1.5–4.5)
Glucose: 82 mg/dL (ref 65–99)
Potassium: 5.1 mmol/L (ref 3.5–5.2)
Sodium: 139 mmol/L (ref 134–144)
Total Protein: 6.8 g/dL (ref 6.0–8.5)

## 2020-02-07 LAB — LIPID PANEL
Chol/HDL Ratio: 4.4 ratio (ref 0.0–4.4)
Cholesterol, Total: 209 mg/dL — ABNORMAL HIGH (ref 100–199)
HDL: 47 mg/dL (ref >39)
LDL Chol Calc (NIH): 135 mg/dL — ABNORMAL HIGH (ref 0–99)
Triglycerides: 153 mg/dL — ABNORMAL HIGH (ref 0–149)
VLDL Cholesterol Cal: 27 mg/dL (ref 5–40)

## 2020-02-07 LAB — VITAMIN B12: Vitamin B-12: 447 pg/mL (ref 232–1245)

## 2020-02-07 LAB — VITAMIN D 25 HYDROXY (VIT D DEFICIENCY, FRACTURES): Vit D, 25-Hydroxy: 60.8 ng/mL (ref 30.0–100.0)

## 2020-02-07 LAB — MAGNESIUM: Magnesium: 2.3 mg/dL (ref 1.6–2.3)

## 2020-03-26 ENCOUNTER — Other Ambulatory Visit: Payer: Self-pay

## 2020-03-26 ENCOUNTER — Ambulatory Visit
Admission: RE | Admit: 2020-03-26 | Discharge: 2020-03-26 | Disposition: A | Discharge status: Home / Self Care | Payer: Medicare Other | Source: Ambulatory Visit | Attending: Internal Medicine | Admitting: Internal Medicine

## 2020-03-26 DIAGNOSIS — Z1231 Encounter for screening mammogram for malignant neoplasm of breast: Secondary | ICD-10-CM | POA: Diagnosis present

## 2020-12-03 ENCOUNTER — Ambulatory Visit: Payer: Medicare Other

## 2020-12-24 ENCOUNTER — Ambulatory Visit (INDEPENDENT_AMBULATORY_CARE_PROVIDER_SITE_OTHER): Payer: Medicare Other

## 2020-12-24 DIAGNOSIS — Z0000 Encounter for general adult medical examination without abnormal findings: Secondary | ICD-10-CM

## 2020-12-24 NOTE — Progress Notes (Signed)
 Subjective:   Carmen Graves is a 65 y.o. female who presents for Medicare Annual (Subsequent) preventive examination.  Virtual Visit via Telephone Note  I connected with  Carmen Graves on 12/24/20 at  1:20 PM EDT by telephone and verified that I am speaking with the correct person using two identifiers.  Location: Patient: home Provider: [facility] Persons participating in the virtual visit: patient/Nurse Health Advisor   I discussed the limitations, risks, security and privacy concerns of performing an evaluation and management service by telephone and the availability of in person appointments. The patient expressed understanding and agreed to proceed.  Interactive audio and video telecommunications were attempted between this nurse and patient, however failed, due to patient having technical difficulties OR patient did not have access to video capability.  We continued and completed visit with audio only.  Some vital signs may be absent or patient reported.   Kasey Uthus, LPN   Review of Systems     Cardiac Risk Factors include: advanced age (>55men, >65 women);hypertension     Objective:    There were no vitals filed for this visit. There is no height or weight on file to calculate BMI.  Advanced Directives 12/24/2020 11/28/2019 01/15/2016 07/18/2015  Does Patient Have a Medical Advance Directive? No No No No  Does patient want to make changes to medical advance directive? - No - Patient declined - -  Would patient like information on creating a medical advance directive? No - Patient declined - Yes - Educational materials given No - patient declined information    Current Medications (verified) Outpatient Encounter Medications as of 12/24/2020  Medication Sig   acetaminophen (TYLENOL) 650 MG CR tablet Take by mouth.   apixaban (ELIQUIS) 5 MG TABS tablet Take 5 mg by mouth 2 (two) times daily.    Boswellia Serrata (BOSWELLIA PO) Take by mouth. Pt takes 3 in am and 3 in pm    calcium citrate-vitamin D (CITRACAL+D) 315-200 MG-UNIT tablet Take 1 tablet by mouth daily.   Cholecalciferol (VITAMIN D3) 50 MCG (2000 UT) capsule Take 1 capsule by mouth daily. 2000 units   diltiazem (CARDIZEM) 30 MG tablet Take 30 mg by mouth as needed.   Glucosamine 500 MG CAPS Take by mouth. Pt takes 1000 mg BID   MAGNESIUM PO Take by mouth.   metoprolol succinate (TOPROL-XL) 25 MG 24 hr tablet Take 25 mg by mouth daily. Pt taking 1/2 tablet daily   risedronate (ACTONEL) 150 MG tablet Take 150 mg by mouth every 30 (thirty) days. with water on empty stomach, nothing by mouth or lie down for next 30 minutes. For bone density   S-Adenosylmethionine (SAM-E PO) Take by mouth. For arthritis   sertraline (ZOLOFT) 25 MG tablet Take 1 tablet (25 mg total) by mouth daily.   vitamin C (ASCORBIC ACID) 500 MG tablet Take 500 mg by mouth daily.   XIIDRA 5 % SOLN Place 1 drop into both eyes in the morning and at bedtime.   No facility-administered encounter medications on file as of 12/24/2020.    Allergies (verified) Albuterol, Budesonide-formoterol fumarate, Fluticasone-salmeterol, Ipratropium-albuterol, Celecoxib, Codeine, Nsaids, Penicillins, and Prednisone   History: Past Medical History:  Diagnosis Date   A-fib (HCC)    Asthma    CHF (congestive heart failure), NYHA class III, chronic, diastolic (HCC) 03/26/2016   Hyperlipidemia    Hypertrophic obstructive cardiomyopathy (HCC)    Neurocardiogenic syncope 02/23/2015   Osteoarthrosis    Osteopenia    Palpitations      Presence of automatic cardioverter/defibrillator (AICD) 03/13/2015   Renal insufficiency    Status post cardiac surgery 03/31/2016   Ventriculomyomectomy, MV revision   Status post cardiac surgery 04/08/2016   Vitamin B deficiency    Past Surgical History:  Procedure Laterality Date   BONY PELVIS SURGERY     tore muscle from pelvic bone   BREAST BIOPSY Bilateral 15+ YRS AGO   NEG   BREAST CYST ASPIRATION Bilateral     CARDIAC DEFIBRILLATOR PLACEMENT  01/2015   for HOCM   MYOMECTOMY  06/2015   TONSILLECTOMY AND ADENOIDECTOMY     UMBILICAL HERNIA REPAIR     Family History  Problem Relation Age of Onset   Muscular dystrophy Mother    Atrial fibrillation Other    Social History   Socioeconomic History   Marital status: Married    Spouse name: Not on file   Number of children: 2   Years of education: Not on file   Highest education level: Not on file  Occupational History   Occupation: retired  Tobacco Use   Smoking status: Never   Smokeless tobacco: Never  Vaping Use   Vaping Use: Never used  Substance and Sexual Activity   Alcohol use: No    Alcohol/week: 0.0 standard drinks   Drug use: No   Sexual activity: Not on file  Other Topics Concern   Not on file  Social History Narrative   Pt follows low sodium diet, no caffeine, avoids sugar.       2 daughters, both married   Social Determinants of Health   Financial Resource Strain: Low Risk    Difficulty of Paying Living Expenses: Not hard at all  Food Insecurity: No Food Insecurity   Worried About Running Out of Food in the Last Year: Never true   Ran Out of Food in the Last Year: Never true  Transportation Needs: No Transportation Needs   Lack of Transportation (Medical): No   Lack of Transportation (Non-Medical): No  Physical Activity: Sufficiently Active   Days of Exercise per Week: 7 days   Minutes of Exercise per Session: 40 min  Stress: No Stress Concern Present   Feeling of Stress : Only a little  Social Connections: Moderately Integrated   Frequency of Communication with Friends and Family: More than three times a week   Frequency of Social Gatherings with Friends and Family: More than three times a week   Attends Religious Services: More than 4 times per year   Active Member of Clubs or Organizations: No   Attends Club or Organization Meetings: Never   Marital Status: Married    Tobacco Counseling Counseling given:  Not Answered   Clinical Intake:  Pre-visit preparation completed: Yes  Pain : No/denies pain     Nutritional Risks: None Diabetes: No  How often do you need to have someone help you when you read instructions, pamphlets, or other written materials from your doctor or pharmacy?: 1 - Never    Interpreter Needed?: No  Information entered by :: Kasey Uthus LPN   Activities of Daily Living In your present state of health, do you have any difficulty performing the following activities: 12/24/2020  Hearing? N  Vision? N  Difficulty concentrating or making decisions? N  Walking or climbing stairs? N  Dressing or bathing? N  Doing errands, shopping? N  Preparing Food and eating ? N  Using the Toilet? N  In the past six months, have you accidently leaked urine? N    Do you have problems with loss of bowel control? N  Managing your Medications? N  Managing your Finances? N  Housekeeping or managing your Housekeeping? N  Some recent data might be hidden    Patient Care Team: Berglund, Laura H, MD as PCP - General (Internal Medicine) Weber, Thomas J, MD as Referring Physician (Endocrinology) Wang, Andrew, MD as Referring Physician (Cardiology) Dr Andrea Lukes Stohl, Amber Dawn, NP as Nurse Practitioner (Cardiology)  Indicate any recent Medical Services you may have received from other than [facility] providers in the past year (date may be approximate).     Assessment:   This is a routine wellness examination for Carmen Graves.  Hearing/Vision screen Hearing Screening - Comments:: Pt denies hearing difficulty Vision Screening - Comments:: Annual vision screenings done at [facility]  Dietary issues and exercise activities discussed: Current Exercise Habits: Home exercise routine, Type of exercise: walking;yoga, Time (Minutes): 40, Frequency (Times/Week): 7, Weekly Exercise (Minutes/Week): 280, Intensity: Mild, Exercise limited by: cardiac condition(s)   Goals Addressed   None     Depression Screen PHQ 2/9 Scores 12/24/2020 02/06/2020 11/28/2019 02/01/2019 05/03/2018 01/20/2018 01/20/2017  PHQ - 2 Score 0 0 0 0 0 0 0  PHQ- 9 Score - 0 - 1 0 1 0    Fall Risk Fall Risk  12/24/2020 11/28/2019 02/01/2019  Falls in the past year? 0 0 0  Number falls in past yr: 0 0 0  Injury with Fall? 0 0 0  Risk for fall due to : No Fall Risks No Fall Risks -  Follow up Falls prevention discussed Falls prevention discussed Falls evaluation completed;Education provided    FALL RISK PREVENTION PERTAINING TO THE HOME:  Any stairs in or around the home? Yes  If so, are there any without handrails? No  Home free of loose throw rugs in walkways, pet beds, electrical cords, etc? Yes  Adequate lighting in your home to reduce risk of falls? Yes   ASSISTIVE DEVICES UTILIZED TO PREVENT FALLS:  Life alert? No  Use of a cane, walker or w/c? No  Grab bars in the bathroom? Yes  Shower chair or bench in shower? No  Elevated toilet seat or a handicapped toilet? No   TIMED UP AND GO:  Was the test performed? No . Telephonic visit.   Cognitive Function:Normal cognitive status assessed by direct observation by this Nurse Health Advisor. No abnormalities found.          Immunizations Immunization History  Administered Date(s) Administered   Fluad Quad(high Dose 65+) 02/01/2019, 02/06/2020   Influenza,inj,Quad PF,6+ Mos 05/02/2015, 03/15/2016, 01/20/2017, 01/20/2018   Influenza-Unspecified 05/05/2015   PFIZER(Purple Top)SARS-COV-2 Vaccination 07/11/2019, 08/01/2019, 03/31/2020   Pneumococcal Conjugate-13 01/15/2016   Pneumococcal Polysaccharide-23 06/13/2014, 06/12/2019   Pneumococcal-Unspecified 06/13/2014   Tdap 11/22/2014    TDAP status: Up to date  Flu Vaccine status: Up to date  Pneumococcal vaccine status: Up to date  Covid-19 vaccine status: Completed vaccines  Qualifies for Shingles Vaccine? Yes   Zostavax completed No   Shingrix Completed?: No.    Education has been  provided regarding the importance of this vaccine. Patient has been advised to call insurance company to determine out of pocket expense if they have not yet received this vaccine. Advised may also receive vaccine at local pharmacy or Health Dept. Verbalized acceptance and understanding.  Screening Tests Health Maintenance  Topic Date Due   COLONOSCOPY (Pts 45-49yrs Insurance coverage will need to be confirmed)  Never done   Zoster Vaccines-   Shingrix (1 of 2) Never done   COVID-19 Vaccine (4 - Booster for Pfizer series) 07/29/2020   INFLUENZA VACCINE  12/29/2020   MAMMOGRAM  03/26/2021   TETANUS/TDAP  11/21/2024   DEXA SCAN  01/08/2030   Hepatitis C Screening  Completed   PNA vac Low Risk Adult  Completed   HPV VACCINES  Aged Out    Health Maintenance  Health Maintenance Due  Topic Date Due   COLONOSCOPY (Pts 45-49yrs Insurance coverage will need to be confirmed)  Never done   Zoster Vaccines- Shingrix (1 of 2) Never done   COVID-19 Vaccine (4 - Booster for Pfizer series) 07/29/2020    Colorectal cancer screening: pt states she has Cologuard kit at home; plans to complete.   Mammogram status: Completed 03/26/20. Repeat every year  Bone Density status: Completed 01/09/20. Results reflect: Bone density results: OSTEOPOROSIS. Repeat every 2 years.  Lung Cancer Screening: (Low Dose CT Chest recommended if Age 55-80 years, 30 pack-year currently smoking OR have quit w/in 15years.) does not qualify.   Additional Screening:  Hepatitis C Screening: does qualify; Completed 01/15/16  Vision Screening: Recommended annual ophthalmology exams for early detection of glaucoma and other disorders of the eye. Is the patient up to date with their annual eye exam?  Yes  Who is the provider or what is the name of the office in which the patient attends annual eye exams? [facility].   Dental Screening: Recommended annual dental exams for proper oral hygiene  Community Resource Referral /  Chronic Care Management: CRR required this visit?  No   CCM required this visit?  No      Plan:     I have personally reviewed and noted the following in the patient's chart:   Medical and social history Use of alcohol, tobacco or illicit drugs  Current medications and supplements including opioid prescriptions.  Functional ability and status Nutritional status Physical activity Advanced directives List of other physicians Hospitalizations, surgeries, and ER visits in previous 12 months Vitals Screenings to include cognitive, depression, and falls Referrals and appointments  In addition, I have reviewed and discussed with patient certain preventive protocols, quality metrics, and best practice recommendations. A written personalized care plan for preventive services as well as general preventive health recommendations were provided to patient.     Kasey Uthus, LPN   12/24/2020   Nurse Notes: none      

## 2020-12-24 NOTE — Patient Instructions (Signed)
Carmen Graves , Thank you for taking time to come for your Medicare Wellness Visit. I appreciate your ongoing commitment to your health goals. Please review the following plan we discussed and let me know if I can assist you in the future.   Screening recommendations/referrals: Colonoscopy: due; please complete Cologuard kit at home Mammogram: done 03/26/20 Bone Density: done 01/09/20 Recommended yearly ophthalmology/optometry visit for glaucoma screening and checkup Recommended yearly dental visit for hygiene and checkup  Vaccinations: Influenza vaccine: done 02/06/20 Pneumococcal vaccine: done 06/12/19 Tdap vaccine: done 11/22/14 Shingles vaccine: Shingrix discussed. Please contact your pharmacy for coverage information.  Covid-19: done 07/11/19, 08/01/19 & 03/31/20  Advanced directives: Advance directive discussed with you today. Even though you declined this today please call our office should you change your mind and we can give you the proper paperwork for you to fill out.   Conditions/risks identified: Keep up the great work!  Next appointment: Follow up in one year for your annual wellness visit    Preventive Care 65 Years and Older, Female Preventive care refers to lifestyle choices and visits with your health care provider that can promote health and wellness. What does preventive care include? A yearly physical exam. This is also called an annual well check. Dental exams once or twice a year. Routine eye exams. Ask your health care provider how often you should have your eyes checked. Personal lifestyle choices, including: Daily care of your teeth and gums. Regular physical activity. Eating a healthy diet. Avoiding tobacco and drug use. Limiting alcohol use. Practicing safe sex. Taking low-dose aspirin every day. Taking vitamin and mineral supplements as recommended by your health care provider. What happens during an annual well check? The services and screenings done by your  health care provider during your annual well check will depend on your age, overall health, lifestyle risk factors, and family history of disease. Counseling  Your health care provider may ask you questions about your: Alcohol use. Tobacco use. Drug use. Emotional well-being. Home and relationship well-being. Sexual activity. Eating habits. History of falls. Memory and ability to understand (cognition). Work and work Statistician. Reproductive health. Screening  You may have the following tests or measurements: Height, weight, and BMI. Blood pressure. Lipid and cholesterol levels. These may be checked every 5 years, or more frequently if you are over 79 years old. Skin check. Lung cancer screening. You may have this screening every year starting at age 44 if you have a 30-pack-year history of smoking and currently smoke or have quit within the past 15 years. Fecal occult blood test (FOBT) of the stool. You may have this test every year starting at age 26. Flexible sigmoidoscopy or colonoscopy. You may have a sigmoidoscopy every 5 years or a colonoscopy every 10 years starting at age 96. Hepatitis C blood test. Hepatitis B blood test. Sexually transmitted disease (STD) testing. Diabetes screening. This is done by checking your blood sugar (glucose) after you have not eaten for a while (fasting). You may have this done every 1-3 years. Bone density scan. This is done to screen for osteoporosis. You may have this done starting at age 69. Mammogram. This may be done every 1-2 years. Talk to your health care provider about how often you should have regular mammograms. Talk with your health care provider about your test results, treatment options, and if necessary, the need for more tests. Vaccines  Your health care provider may recommend certain vaccines, such as: Influenza vaccine. This is recommended every year.  Tetanus, diphtheria, and acellular pertussis (Tdap, Td) vaccine. You may  need a Td booster every 10 years. Zoster vaccine. You may need this after age 33. Pneumococcal 13-valent conjugate (PCV13) vaccine. One dose is recommended after age 49. Pneumococcal polysaccharide (PPSV23) vaccine. One dose is recommended after age 31. Talk to your health care provider about which screenings and vaccines you need and how often you need them. This information is not intended to replace advice given to you by your health care provider. Make sure you discuss any questions you have with your health care provider. Document Released: 06/13/2015 Document Revised: 02/04/2016 Document Reviewed: 03/18/2015 Elsevier Interactive Patient Education  2017 [place] Prevention in the Home Falls can cause injuries. They can happen to people of all ages. There are many things you can do to make your home safe and to help prevent falls. What can I do on the outside of my home? Regularly fix the edges of walkways and driveways and fix any cracks. Remove anything that might make you trip as you walk through a door, such as a raised step or threshold. Trim any bushes or trees on the path to your home. Use bright outdoor lighting. Clear any walking paths of anything that might make someone trip, such as rocks or tools. Regularly check to see if handrails are loose or broken. Make sure that both sides of any steps have handrails. Any raised decks and porches should have guardrails on the edges. Have any leaves, snow, or ice cleared regularly. Use sand or salt on walking paths during winter. Clean up any spills in your garage right away. This includes oil or grease spills. What can I do in the bathroom? Use night lights. Install grab bars by the toilet and in the tub and shower. Do not use towel bars as grab bars. Use non-skid mats or decals in the tub or shower. If you need to sit down in the shower, use a plastic, non-slip stool. Keep the floor dry. Clean up any water that spills on  the floor as soon as it happens. Remove soap buildup in the tub or shower regularly. Attach bath mats securely with double-sided non-slip rug tape. Do not have throw rugs and other things on the floor that can make you trip. What can I do in the bedroom? Use night lights. Make sure that you have a light by your bed that is easy to reach. Do not use any sheets or blankets that are too big for your bed. They should not hang down onto the floor. Have a firm chair that has side arms. You can use this for support while you get dressed. Do not have throw rugs and other things on the floor that can make you trip. What can I do in the kitchen? Clean up any spills right away. Avoid walking on wet floors. Keep items that you use a lot in easy-to-reach places. If you need to reach something above you, use a strong step stool that has a grab bar. Keep electrical cords out of the way. Do not use floor polish or wax that makes floors slippery. If you must use wax, use non-skid floor wax. Do not have throw rugs and other things on the floor that can make you trip. What can I do with my stairs? Do not leave any items on the stairs. Make sure that there are handrails on both sides of the stairs and use them. Fix handrails that are broken or loose.  Make sure that handrails are as long as the stairways. Check any carpeting to make sure that it is firmly attached to the stairs. Fix any carpet that is loose or worn. Avoid having throw rugs at the top or bottom of the stairs. If you do have throw rugs, attach them to the floor with carpet tape. Make sure that you have a light switch at the top of the stairs and the bottom of the stairs. If you do not have them, ask someone to add them for you. What else can I do to help prevent falls? Wear shoes that: Do not have high heels. Have rubber bottoms. Are comfortable and fit you well. Are closed at the toe. Do not wear sandals. If you use a stepladder: Make sure  that it is fully opened. Do not climb a closed stepladder. Make sure that both sides of the stepladder are locked into place. Ask someone to hold it for you, if possible. Clearly mark and make sure that you can see: Any grab bars or handrails. First and last steps. Where the edge of each step is. Use tools that help you move around (mobility aids) if they are needed. These include: Canes. Walkers. Scooters. Crutches. Turn on the lights when you go into a dark area. Replace any light bulbs as soon as they burn out. Set up your furniture so you have a clear path. Avoid moving your furniture around. If any of your floors are uneven, fix them. If there are any pets around you, be aware of where they are. Review your medicines with your doctor. Some medicines can make you feel dizzy. This can increase your chance of falling. Ask your doctor what other things that you can do to help prevent falls. This information is not intended to replace advice given to you by your health care provider. Make sure you discuss any questions you have with your health care provider. Document Released: 03/13/2009 Document Revised: 10/23/2015 Document Reviewed: 06/21/2014 Elsevier Interactive Patient Education  2017 [employer].

## 2021-02-11 ENCOUNTER — Encounter: Payer: Self-pay | Admitting: Internal Medicine

## 2021-02-11 ENCOUNTER — Ambulatory Visit (INDEPENDENT_AMBULATORY_CARE_PROVIDER_SITE_OTHER): Payer: Medicare Other | Admitting: Internal Medicine

## 2021-02-11 ENCOUNTER — Other Ambulatory Visit: Payer: Self-pay

## 2021-02-11 VITALS — BP 122/72 | HR 71 | Temp 98.0°F | Ht 64.0 in | Wt 137.0 lb

## 2021-02-11 DIAGNOSIS — F419 Anxiety disorder, unspecified: Secondary | ICD-10-CM

## 2021-02-11 DIAGNOSIS — E785 Hyperlipidemia, unspecified: Secondary | ICD-10-CM | POA: Diagnosis not present

## 2021-02-11 DIAGNOSIS — F32A Depression, unspecified: Secondary | ICD-10-CM | POA: Diagnosis not present

## 2021-02-11 DIAGNOSIS — Z23 Encounter for immunization: Secondary | ICD-10-CM | POA: Diagnosis not present

## 2021-02-11 DIAGNOSIS — Z1211 Encounter for screening for malignant neoplasm of colon: Secondary | ICD-10-CM

## 2021-02-11 DIAGNOSIS — I421 Obstructive hypertrophic cardiomyopathy: Secondary | ICD-10-CM

## 2021-02-11 DIAGNOSIS — Z1231 Encounter for screening mammogram for malignant neoplasm of breast: Secondary | ICD-10-CM

## 2021-02-11 DIAGNOSIS — M810 Age-related osteoporosis without current pathological fracture: Secondary | ICD-10-CM

## 2021-02-11 DIAGNOSIS — D6869 Other thrombophilia: Secondary | ICD-10-CM

## 2021-02-11 MED ORDER — SERTRALINE HCL 25 MG PO TABS: 25.0000 mg | ORAL_TABLET | Freq: Every day | ORAL | 3 refills | Status: DC

## 2021-02-11 MED ORDER — SHINGRIX 50 MCG/0.5ML IM SUSR: 0.5000 mL | Freq: Once | INTRAMUSCULAR | 1 refills | Status: AC

## 2021-02-11 NOTE — Progress Notes (Signed)
Date:  02/11/2021   Name:  Carmen Graves   DOB:  1956/01/18   MRN:  [id number]   Chief Complaint: Annual Exam (Breast exam no pap) and Flu Vaccine Carmen Graves is a 65 y.o. female who presents today for her Complete Annual Exam. She feels well. She reports exercising walking 3- 4 days a week, yoga and dance 4 times a week. She reports she is sleeping well. Breast complaints none.  Mammogram: 02/2020 DEXA: 12/2019 osteoporosis Pap smear: discontinued Colonoscopy: cologuard ordered but not done  Immunization History  Administered Date(s) Administered   Fluad Quad(high Dose 65+) 02/01/2019, 02/06/2020   Influenza,inj,Quad PF,6+ Mos 05/02/2015, 03/15/2016, 01/20/2017, 01/20/2018   Influenza-Unspecified 05/05/2015   PFIZER(Purple Top)SARS-COV-2 Vaccination 07/11/2019, 08/01/2019, 03/31/2020   Pneumococcal Conjugate-13 01/15/2016   Pneumococcal Polysaccharide-23 06/13/2014, 06/12/2019   Pneumococcal-Unspecified 06/13/2014   Tdap 11/22/2014    Depression        This is a chronic problem.  The problem has been resolved since onset.  Associated symptoms include no fatigue and no headaches.     The symptoms are aggravated by social issues.  Past treatments include SSRIs - Selective serotonin reuptake inhibitors.  Compliance with treatment is good.  Previous treatment provided significant relief. Osteoporosis - on Actonel with plans for DEXA next year.  Lab Results  Component Value Date   CREATININE 0.64 02/06/2020   BUN 16 02/06/2020   NA 139 02/06/2020   K 5.1 02/06/2020   CL 103 02/06/2020   CO2 23 02/06/2020   Lab Results  Component Value Date   CHOL 209 (H) 02/06/2020   HDL 47 02/06/2020   LDLCALC 135 (H) 02/06/2020   TRIG 153 (H) 02/06/2020   CHOLHDL 4.4 02/06/2020   Lab Results  Component Value Date   TSH 0.761 02/06/2020   No results found for: HGBA1C Lab Results  Component Value Date   WBC 6.5 02/06/2020   HGB 13.8 02/06/2020   HCT 41.7 02/06/2020   MCV 93  02/06/2020   PLT 200 02/06/2020   Lab Results  Component Value Date   ALT 19 02/06/2020   AST 20 02/06/2020   ALKPHOS 64 02/06/2020   BILITOT 0.6 02/06/2020     Review of Systems  Constitutional:  Negative for chills, fatigue and fever.  HENT:  Negative for congestion, hearing loss, tinnitus, trouble swallowing and voice change.   Eyes:  Negative for visual disturbance.  Respiratory:  Negative for cough, chest tightness, shortness of breath and wheezing.   Cardiovascular:  Negative for chest pain, palpitations and leg swelling.  Gastrointestinal:  Negative for abdominal pain, constipation, diarrhea and vomiting.  Endocrine: Negative for polydipsia and polyuria.  Genitourinary:  Negative for dysuria, frequency, genital sores, vaginal bleeding and vaginal discharge.  Musculoskeletal:  Negative for arthralgias, gait problem and joint swelling.  Skin:  Negative for color change and rash.  Neurological:  Negative for dizziness, tremors, light-headedness and headaches.  Hematological:  Negative for adenopathy. Does not bruise/bleed easily.  Psychiatric/Behavioral:  Positive for depression. Negative for dysphoric mood and sleep disturbance. The patient is not nervous/anxious.    Patient Active Problem List   Diagnosis Date Noted   Acquired thrombophilia ([place]) 02/11/2021   Need for prophylactic antibiotic 04/08/2016   Paroxysmal atrial fibrillation ([place]) 06/20/2015   Abnormal genetic test 05/08/2015   Presence of automatic cardioverter/defibrillator (AICD) 03/13/2015   Anxiety and depression 03/07/2015   Hyperlipidemia, mild 03/05/2015   Osteoporosis 03/05/2015   Hypertrophic obstructive cardiomyopathy ([place]) 02/27/2015  Allergies  Allergen Reactions   Albuterol Shortness Of Breath    Chest pain and SOB   Budesonide-Formoterol Fumarate Shortness Of Breath    Chest pain, severe SOB    Fluticasone-Salmeterol Shortness Of Breath   Ipratropium-Albuterol Shortness Of Breath     Chest pain   Celecoxib     Other reaction(s): Kidney Disorder   Codeine Nausea Only   Nsaids Other (See Comments)    Causes kidney disease    Penicillins    Prednisone Other (See Comments)    Chest pain and tightness    Past Surgical History:  Procedure Laterality Date   BONY PELVIS SURGERY     tore muscle from pelvic bone   BREAST BIOPSY Bilateral 15+ YRS AGO   NEG   BREAST CYST ASPIRATION Bilateral    CARDIAC DEFIBRILLATOR PLACEMENT  01/2015   for HOCM   MYOMECTOMY  06/2015   TONSILLECTOMY AND ADENOIDECTOMY     UMBILICAL HERNIA REPAIR      Social History   Tobacco Use   Smoking status: Never   Smokeless tobacco: Never  Vaping Use   Vaping Use: Never used  Substance Use Topics   Alcohol use: No    Alcohol/week: 0.0 standard drinks   Drug use: No     Medication list has been reviewed and updated.  Current Meds  Medication Sig   acetaminophen (TYLENOL) 650 MG CR tablet Take by mouth.   apixaban (ELIQUIS) 5 MG TABS tablet Take 5 mg by mouth 2 (two) times daily.    bimatoprost (LATISSE) 0.03 % ophthalmic solution Place one drop on applicator and apply evenly at the base of eyelashes nightly. Repeat procedure for the other eye.   Boswellia Serrata (BOSWELLIA PO) Take by mouth. Pt takes 3 in am and 3 in pm   calcium citrate-vitamin D (CITRACAL+D) 315-200 MG-UNIT tablet Take 1 tablet by mouth daily.   Cholecalciferol (VITAMIN D3) 50 MCG (2000 UT) capsule Take 1 capsule by mouth daily. 2000 units   diltiazem (CARDIZEM) 30 MG tablet Take 30 mg by mouth as needed.   Glucosamine 500 MG CAPS Take by mouth. Pt takes 1000 mg BID   MAGNESIUM PO Take by mouth.   metoprolol succinate (TOPROL-XL) 25 MG 24 hr tablet Take 25 mg by mouth daily. Pt taking 1/2 tablet daily   Pilocarpine HCl 1.25 % SOLN Apply to eye.   risedronate (ACTONEL) 150 MG tablet Take 150 mg by mouth every 30 (thirty) days. with water on empty stomach, nothing by mouth or lie down for next 30 minutes. For bone  density   S-Adenosylmethionine (SAM-E PO) Take by mouth. For arthritis   sertraline (ZOLOFT) 25 MG tablet Take 1 tablet (25 mg total) by mouth daily.   vitamin C (ASCORBIC ACID) 500 MG tablet Take 500 mg by mouth daily.   XIIDRA 5 % SOLN Place 1 drop into both eyes in the morning and at bedtime.    PHQ 2/9 Scores 12/24/2020 02/06/2020 11/28/2019 02/01/2019  PHQ - 2 Score 0 0 0 0  PHQ- 9 Score - 0 - 1    GAD 7 : Generalized Anxiety Score 02/06/2020  Nervous, Anxious, on Edge 0  Control/stop worrying 1  Worry too much - different things 1  Trouble relaxing 0  Restless 0  Easily annoyed or irritable 0  Afraid - awful might happen 0  Total GAD 7 Score 2  Anxiety Difficulty Not difficult at all    BP Readings from Last 3 Encounters:  02/11/21 122/72  02/06/20 112/74  02/01/19 134/78    Physical Exam Vitals and nursing note reviewed.  Constitutional:      General: She is not in acute distress.    Appearance: She is well-developed.  HENT:     Head: Normocephalic and atraumatic.     Right Ear: Tympanic membrane and ear canal normal.     Left Ear: Tympanic membrane and ear canal normal.     Nose:     Right Sinus: No maxillary sinus tenderness.     Left Sinus: No maxillary sinus tenderness.  Eyes:     General: No scleral icterus.       Right eye: No discharge.        Left eye: No discharge.     Conjunctiva/sclera: Conjunctivae normal.  Neck:     Thyroid: No thyromegaly.     Vascular: No carotid bruit.  Cardiovascular:     Rate and Rhythm: Normal rate and regular rhythm.     Pulses: Normal pulses.     Heart sounds: Normal heart sounds.  Pulmonary:     Effort: Pulmonary effort is normal. No respiratory distress.     Breath sounds: No wheezing.  Chest:  Breasts:    Right: No mass, nipple discharge, skin change or tenderness.     Left: No mass, nipple discharge, skin change or tenderness.  Abdominal:     General: Bowel sounds are normal.     Palpations: Abdomen is soft.      Tenderness: There is no abdominal tenderness.  Musculoskeletal:     Cervical back: Normal range of motion. No erythema.     Right lower leg: No edema.     Left lower leg: No edema.  Lymphadenopathy:     Cervical: No cervical adenopathy.  Skin:    General: Skin is warm and dry.     Findings: No rash.  Neurological:     Mental Status: She is alert and oriented to person, place, and time.     Cranial Nerves: No cranial nerve deficit.     Sensory: No sensory deficit.     Deep Tendon Reflexes: Reflexes are normal and symmetric.  Psychiatric:        Attention and Perception: Attention normal.        Mood and Affect: Mood normal.    Wt Readings from Last 3 Encounters:  02/11/21 137 lb (62.1 kg)  02/06/20 137 lb (62.1 kg)  02/01/19 133 lb (60.3 kg)    BP 122/72   Pulse 71   Temp 98 F (36.7 C) (Oral)   Ht '5\' 4"'$  (1.626 m)   Wt 137 lb (62.1 kg)   SpO2 99%   BMI 23.52 kg/m   Assessment and Plan: 1. Anxiety and depression Clinically stable on current regimen with good control of symptoms, No SI or HI. Will continue current therapy. - TSH  2. Encounter for screening mammogram for breast cancer - MM 3D SCREEN BREAST BILATERAL  3. Colon cancer screening - Cologuard  4. Hyperlipidemia, mild Will check labs and advise - Comprehensive metabolic panel - Lipid panel  5. Hypertrophic obstructive cardiomyopathy ([place]) Doing well, only one second episode of Afib Remains on Eliquis  6. Osteoporosis, unspecified osteoporosis type, unspecified pathological fracture presence On medication and followed by Endo  7. Acquired thrombophilia ([place]) Doing well, no bleeding issues - CBC with Differential/Platelet   Partially dictated using Editor, commissioning. Any errors are unintentional.  Halina Maidens, MD [facility]  02/11/2021

## 2021-02-12 LAB — CBC WITH DIFFERENTIAL/PLATELET
Basophils Absolute: 0.1 10*3/uL (ref 0.0–0.2)
Basos: 1 %
EOS (ABSOLUTE): 0.1 10*3/uL (ref 0.0–0.4)
Eos: 1 %
Hematocrit: 41.4 % (ref 34.0–46.6)
Hemoglobin: 13.4 g/dL (ref 11.1–15.9)
Immature Grans (Abs): 0 10*3/uL (ref 0.0–0.1)
Immature Granulocytes: 0 %
Lymphocytes Absolute: 1.2 10*3/uL (ref 0.7–3.1)
Lymphs: 25 %
MCH: 30 pg (ref 26.6–33.0)
MCHC: 32.4 g/dL (ref 31.5–35.7)
MCV: 93 fL (ref 79–97)
Monocytes Absolute: 0.4 10*3/uL (ref 0.1–0.9)
Monocytes: 8 %
Neutrophils Absolute: 3 10*3/uL (ref 1.4–7.0)
Neutrophils: 65 %
Platelets: 216 10*3/uL (ref 150–450)
RBC: 4.46 x10E6/uL (ref 3.77–5.28)
RDW: 12.2 % (ref 11.7–15.4)
WBC: 4.7 10*3/uL (ref 3.4–10.8)

## 2021-02-12 LAB — COMPREHENSIVE METABOLIC PANEL
ALT: 22 IU/L (ref 0–32)
AST: 24 IU/L (ref 0–40)
Albumin/Globulin Ratio: 2.4 — ABNORMAL HIGH (ref 1.2–2.2)
Albumin: 4.6 g/dL (ref 3.8–4.8)
Alkaline Phosphatase: 67 IU/L (ref 44–121)
BUN/Creatinine Ratio: 22 (ref 12–28)
BUN: 14 mg/dL (ref 8–27)
Bilirubin Total: 0.4 mg/dL (ref 0.0–1.2)
CO2: 23 mmol/L (ref 20–29)
Calcium: 9.3 mg/dL (ref 8.7–10.3)
Chloride: 104 mmol/L (ref 96–106)
Creatinine, Ser: 0.63 mg/dL (ref 0.57–1.00)
Globulin, Total: 1.9 g/dL (ref 1.5–4.5)
Glucose: 90 mg/dL (ref 65–99)
Potassium: 4.7 mmol/L (ref 3.5–5.2)
Sodium: 142 mmol/L (ref 134–144)
Total Protein: 6.5 g/dL (ref 6.0–8.5)
eGFR: 97 mL/min/{1.73_m2} (ref >59)

## 2021-02-12 LAB — TSH: TSH: 0.89 u[IU]/mL (ref 0.450–4.500)

## 2021-02-12 LAB — LIPID PANEL
Chol/HDL Ratio: 4.8 ratio — ABNORMAL HIGH (ref 0.0–4.4)
Cholesterol, Total: 214 mg/dL — ABNORMAL HIGH (ref 100–199)
HDL: 45 mg/dL (ref >39)
LDL Chol Calc (NIH): 148 mg/dL — ABNORMAL HIGH (ref 0–99)
Triglycerides: 119 mg/dL (ref 0–149)
VLDL Cholesterol Cal: 21 mg/dL (ref 5–40)

## 2021-02-19 ENCOUNTER — Ambulatory Visit (INDEPENDENT_AMBULATORY_CARE_PROVIDER_SITE_OTHER): Payer: Medicare Other | Admitting: Dermatology

## 2021-02-19 ENCOUNTER — Other Ambulatory Visit: Payer: Self-pay

## 2021-02-19 DIAGNOSIS — L988 Other specified disorders of the skin and subcutaneous tissue: Secondary | ICD-10-CM

## 2021-02-19 DIAGNOSIS — L814 Other melanin hyperpigmentation: Secondary | ICD-10-CM

## 2021-02-19 DIAGNOSIS — L821 Other seborrheic keratosis: Secondary | ICD-10-CM

## 2021-02-19 DIAGNOSIS — D1800 Hemangioma unspecified site: Secondary | ICD-10-CM

## 2021-02-19 DIAGNOSIS — L719 Rosacea, unspecified: Secondary | ICD-10-CM | POA: Diagnosis not present

## 2021-02-19 DIAGNOSIS — L853 Xerosis cutis: Secondary | ICD-10-CM | POA: Diagnosis not present

## 2021-02-19 DIAGNOSIS — Z1283 Encounter for screening for malignant neoplasm of skin: Secondary | ICD-10-CM | POA: Diagnosis not present

## 2021-02-19 DIAGNOSIS — L578 Other skin changes due to chronic exposure to nonionizing radiation: Secondary | ICD-10-CM

## 2021-02-19 DIAGNOSIS — D229 Melanocytic nevi, unspecified: Secondary | ICD-10-CM

## 2021-02-19 MED ORDER — KETOCONAZOLE 2 % EX SHAM: 1.0000 "application " | MEDICATED_SHAMPOO | CUTANEOUS | 3 refills | Status: DC

## 2021-02-19 MED ORDER — DOXYCYCLINE HYCLATE 100 MG PO CAPS: 100.0000 mg | ORAL_CAPSULE | Freq: Every day | ORAL | 3 refills | Status: DC

## 2021-02-19 NOTE — Progress Notes (Signed)
New Patient Visit  Subjective  Carmen Graves is a 65 y.o. female who presents for the following: Other (Spots on scalp that itch and get sore x ~5 months. She changed hair stylists and he started her on [employer] products which have seemed to help clear the area. Desires TBSE today). The patient presents for Total-Body Skin Exam (TBSE) for skin cancer screening and mole check.  The following portions of the chart were reviewed this encounter and updated as appropriate:   Tobacco  Allergies  Meds  Problems  Med Hx  Surg Hx  Fam Hx     Review of Systems:  No other skin or systemic complaints except as noted in HPI or Assessment and Plan.  Objective  Well appearing patient in no apparent distress; mood and affect are within normal limits.  A full examination was performed including scalp, head, eyes, ears, nose, lips, neck, chest, axillae, abdomen, back, buttocks, bilateral upper extremities, bilateral lower extremities, hands, feet, fingers, toes, fingernails, and toenails. All findings within normal limits unless otherwise noted below.  Scalp Crust of right scalp  Head - Anterior (Face) Rhytides and volume loss.    Assessment & Plan   Lentigines - Scattered tan macules - Due to sun exposure - Benign-appearing, observe - Recommend daily broad spectrum sunscreen SPF 30+ to sun-exposed areas, reapply every 2 hours as needed. - Call for any changes  Seborrheic Keratoses - Stuck-on, waxy, tan-brown papules and/or plaques  - Benign-appearing - Discussed benign etiology and prognosis. - Observe - Call for any changes  Melanocytic Nevi - Tan-brown and/or pink-flesh-colored symmetric macules and papules - Benign appearing on exam today - Observation - Call clinic for new or changing moles - Recommend daily use of broad spectrum spf 30+ sunscreen to sun-exposed areas.   Hemangiomas - Red papules - Discussed benign nature - Observe - Call for any changes  Actinic  Damage - Chronic condition, secondary to cumulative UV/sun exposure - diffuse scaly erythematous macules with underlying dyspigmentation - Recommend daily broad spectrum sunscreen SPF 30+ to sun-exposed areas, reapply every 2 hours as needed.  - Staying in the shade or wearing long sleeves, sun glasses (UVA+UVB protection) and wide brim hats (4-inch brim around the entire circumference of the hat) are also recommended for sun protection.  - Call for new or changing lesions.  Skin cancer screening performed today.  Rosacea / Folliculitis of scalp Scalp  Rosacea is a chronic progressive skin condition usually affecting the face of adults, causing redness and/or acne bumps. It is treatable but not curable. It sometimes affects the eyes (ocular rosacea) as well. It may respond to topical and/or systemic medication and can flare with stress, sun exposure, alcohol, exercise and some foods.  Daily application of broad spectrum spf 30+ sunscreen to face is recommended to reduce flares.  Start Doxycycline 100 mg 1 po qd with food and plenty of fluid, ketoconazole 2% shampoo to shampoo scalp weekly, let sit a few minutes before rinsing - advised patient she may use her regular shampoo and conditioner after rinsing.  doxycycline (VIBRAMYCIN) 100 MG capsule - Scalp Take 1 capsule (100 mg total) by mouth daily. With food and plenty of fluid  ketoconazole (NIZORAL) 2 % shampoo - Scalp Apply 1 application topically once a week. Let sit a few minutes before rinsing  Xerosis cutis  Xerosis and crepey skin - Recommend Alastin Body and Cerave cream  Elastosis of skin Head - Anterior (Face)  Discussed fillers - patient will  schedule appointment.  Patient had questions about Omega 8 and Emface. Dr Nehemiah Massed advised patient he will have to research these as he is not familiar with them.  Patient also had questions regarding eyelid lift and possible rhinoplasty. For the eyelids, we recommend Dr. Norlene Duel  or Dr. Heron Nay, both at [facility] and for the rhinoplasty, we recommend Dr. Ron Parker.  Skin cancer screening  Return in about 2 months (around 04/21/2021) for Follow up scalp, appointment for fillers.  I, Ashok Cordia, CMA, am acting as scribe for Sarina Ser, MD . Documentation: I have reviewed the above documentation for accuracy and completeness, and I agree with the above.  Sarina Ser, MD

## 2021-02-19 NOTE — Patient Instructions (Addendum)
Dryness and crepey skin - Recommend Alastin Body and Cerave cream  Eye drs at [place] - Dr. Norlene Duel or Dr. Heron Nay, Facial plastic surgeon - Dr. Ron Parker    If you have any questions or concerns for your doctor, please call our main line at [phone] and press option 4 to reach your doctor's medical assistant. If no one answers, please leave a voicemail as directed and we will return your call as soon as possible. Messages left after 4 pm will be answered the following business day.   You may also send [place] a message via [place]. We typically respond to MyChart messages within 1-2 business days.  For prescription refills, please ask your pharmacy to contact our office. Our fax number is [phone].  If you have an urgent issue when the clinic is closed that cannot wait until the next business day, you can page your doctor at the number below.    Please note that while we do our best to be available for urgent issues outside of office hours, we are not available 24/7.   If you have an urgent issue and are unable to reach [place], you may choose to seek medical care at your doctor's office, retail clinic, urgent care center, or emergency room.  If you have a medical emergency, please immediately call 911 or go to the emergency department.  Pager Numbers  - Dr. Nehemiah Massed: [phone]  - Dr. Laurence Ferrari: [phone]  - Dr. Nicole Kindred: [phone]  In the event of inclement weather, please call our main line at [phone] for an update on the status of any delays or closures.  Dermatology Medication Tips: Please keep the boxes that topical medications come in in order to help keep track of the instructions about where and how to use these. Pharmacies typically print the medication instructions only on the boxes and not directly on the medication tubes.   If your medication is too expensive, please contact our office at [phone] option 4 or send [place] a message through [place].   We  are unable to tell what your co-pay for medications will be in advance as this is different depending on your insurance coverage. However, we may be able to find a substitute medication at lower cost or fill out paperwork to get insurance to cover a needed medication.   If a prior authorization is required to get your medication covered by your insurance company, please allow [place] 1-2 business days to complete this process.  Drug prices often vary depending on where the prescription is filled and some pharmacies may offer cheaper prices.  The website www.goodrx.com contains coupons for medications through different pharmacies. The prices here do not account for what the cost may be with help from insurance (it may be cheaper with your insurance), but the website can give you the price if you did not use any insurance.  - You can print the associated coupon and take it with your prescription to the pharmacy.  - You may also stop by our office during regular business hours and pick up a GoodRx coupon card.  - If you need your prescription sent electronically to a different pharmacy, notify our office through [facility] or by phone at [phone] option 4.

## 2021-02-24 ENCOUNTER — Encounter: Payer: Self-pay | Admitting: Dermatology

## 2021-03-11 ENCOUNTER — Ambulatory Visit: Payer: BLUE CROSS/BLUE SHIELD | Admitting: Dermatology

## 2021-04-14 ENCOUNTER — Other Ambulatory Visit: Payer: Self-pay | Admitting: Internal Medicine

## 2021-04-14 DIAGNOSIS — F419 Anxiety disorder, unspecified: Secondary | ICD-10-CM

## 2021-04-14 DIAGNOSIS — F32A Depression, unspecified: Secondary | ICD-10-CM

## 2021-04-14 NOTE — Telephone Encounter (Signed)
Duplicate. Please see previous encounter.

## 2021-04-14 NOTE — Telephone Encounter (Signed)
Medication Refill - Medication:  sertraline (ZOLOFT) 25 MG tablet   Has the patient contacted their pharmacy? Yes.   Contact PCP-have already sent over request  Preferred Pharmacy (with phone number or street name):  [facility] - [place], [place] - [address] Phone:  [phone]  Fax:  [phone]      Has the patient been seen for an appointment in the last year OR does the patient have an upcoming appointment? Yes.    Agent: Please be advised that RX refills may take up to 3 business days. We ask that you follow-up with your pharmacy.

## 2021-04-14 NOTE — Telephone Encounter (Signed)
[facility] called about needing an Rx for sertraline (ZOLOFT) 25 MG tablet / please advise

## 2021-04-14 NOTE — Telephone Encounter (Signed)
Requested medication (s) are due for refill today   No Requested medication (s) are on the active medication list: yes  Last refill: 02/11/21  #90 3 refills  Future visit scheduled Yes 02/11/22  Notes to clinic: CAll from pharmacy requesting refill. Pharmacy closed, after hours. Pt should have supply of meds. Please review thank you.  Requested Prescriptions  Pending Prescriptions Disp Refills   sertraline (ZOLOFT) 25 MG tablet 90 tablet 3    Sig: Take 1 tablet (25 mg total) by mouth daily.     Psychiatry:  Antidepressants - SSRI Passed - 04/14/2021  4:08 PM      Passed - Completed PHQ-2 or PHQ-9 in the last 360 days      Passed - Valid encounter within last 6 months    Recent Outpatient Visits           2 months ago Anxiety and depression   [facility] Glean Hess, MD   1 year ago Anxiety and depression   [facility] Glean Hess, MD   2 years ago Anxiety and depression   [facility] Glean Hess, MD   2 years ago Acute non-recurrent maxillary sinusitis   [facility] Glean Hess, MD   3 years ago Annual physical exam   [facility] Glean Hess, MD       Future Appointments             In 2 weeks Ralene Bathe, MD [place]   In 10 months [place] Melia Jesse Sans, MD [facility], [facility]

## 2021-04-15 MED ORDER — SERTRALINE HCL 25 MG PO TABS: 25.0000 mg | ORAL_TABLET | Freq: Every day | ORAL | 0 refills | Status: DC

## 2021-04-15 NOTE — Telephone Encounter (Signed)
Left voice mail to call back to set up appointment

## 2021-04-30 ENCOUNTER — Ambulatory Visit: Payer: BLUE CROSS/BLUE SHIELD | Admitting: Dermatology

## 2021-05-12 ENCOUNTER — Telehealth: Payer: Self-pay

## 2021-05-12 NOTE — Telephone Encounter (Signed)
Called pt left VM as a reminder to complete cologuard.  PEC nurse may give results to patient if they return call to clinic, a CRM has been created.   KP

## 2021-06-04 ENCOUNTER — Other Ambulatory Visit: Payer: Self-pay

## 2021-06-04 DIAGNOSIS — L719 Rosacea, unspecified: Secondary | ICD-10-CM

## 2021-06-04 MED ORDER — DOXYCYCLINE HYCLATE 100 MG PO CAPS: 100.0000 mg | ORAL_CAPSULE | Freq: Every day | ORAL | 3 refills | Status: AC

## 2021-06-04 NOTE — Progress Notes (Signed)
1 medication RF to help patient get to her appointment. aw

## 2021-06-11 ENCOUNTER — Ambulatory Visit: Payer: Self-pay | Admitting: Dermatology

## 2021-07-13 ENCOUNTER — Ambulatory Visit: Payer: BLUE CROSS/BLUE SHIELD | Admitting: Dermatology

## 2021-09-07 ENCOUNTER — Ambulatory Visit: Payer: Medicare Other | Admitting: Dermatology

## 2021-09-21 DIAGNOSIS — D509 Iron deficiency anemia, unspecified: Secondary | ICD-10-CM | POA: Insufficient documentation

## 2021-09-21 DIAGNOSIS — E559 Vitamin D deficiency, unspecified: Secondary | ICD-10-CM | POA: Insufficient documentation

## 2021-09-21 DIAGNOSIS — M5020 Other cervical disc displacement, unspecified cervical region: Secondary | ICD-10-CM | POA: Insufficient documentation

## 2021-12-28 ENCOUNTER — Ambulatory Visit: Payer: Medicare Other | Admitting: Nurse Practitioner

## 2021-12-28 ENCOUNTER — Encounter: Payer: Self-pay | Admitting: Nurse Practitioner

## 2021-12-28 DIAGNOSIS — Z0000 Encounter for general adult medical examination without abnormal findings: Secondary | ICD-10-CM | POA: Diagnosis not present

## 2021-12-28 NOTE — Progress Notes (Signed)
I connected with  SHAKOYA GILMORE on 12/28/21 by a audio enabled telemedicine application and verified that I am speaking with the correct person using two identifiers.  Patient Location: Home  Provider Location: Home Office  I discussed the limitations of evaluation and management by telemedicine. The patient expressed understanding and agreed to proceed.      Annual Wellness Visit     Patient: Carmen Graves, Female    DOB: 1955/10/30, 67 y.o.   MRN: [id number]  Subjective  Chief Complaint  Patient presents with   annual well visit    Carmen Graves is a 66 y.o. female who presents today for her Annual Wellness Visit. She reports consuming a low fat and low sodium diet. Home exercise routine includes stretching and walking .5 hrs per day. She generally feels well. She reports sleeping well. She does not have additional problems to discuss today.     Vision:Within last year and Dental: No current dental problems and Receives regular dental care   Patient Active Problem List   Diagnosis Date Noted   Acquired thrombophilia ([place]) 02/11/2021   Need for prophylactic antibiotic 04/08/2016   Abnormal genetic test 05/08/2015   Presence of automatic cardioverter/defibrillator (AICD) 03/13/2015   Anxiety and depression 03/07/2015   Hyperlipidemia, mild 03/05/2015   Osteoporosis 03/05/2015   Hypertrophic obstructive cardiomyopathy ([place]) 02/27/2015   Past Medical History:  Diagnosis Date   A-fib ([place])    Asthma    CHF (congestive heart failure), NYHA class III, chronic, diastolic ([place]) 28/41/3244   Hyperlipidemia    Hypertrophic obstructive cardiomyopathy ([place])    Neurocardiogenic syncope 02/23/2015   Osteoarthrosis    Osteopenia    Palpitations    Presence of automatic cardioverter/defibrillator (AICD) 03/13/2015   Renal insufficiency    Status post cardiac surgery 03/31/2016   Ventriculomyomectomy, MV revision   Status post cardiac surgery 04/08/2016   Vitamin B  deficiency    Past Surgical History:  Procedure Laterality Date   BONY PELVIS SURGERY     tore muscle from pelvic bone   BREAST BIOPSY Bilateral 15+ YRS AGO   NEG   BREAST CYST ASPIRATION Bilateral    CARDIAC DEFIBRILLATOR PLACEMENT  01/2015   for HOCM   MYOMECTOMY  06/2015   TONSILLECTOMY AND ADENOIDECTOMY     UMBILICAL HERNIA REPAIR     Social History   Tobacco Use   Smoking status: Never   Smokeless tobacco: Never  Vaping Use   Vaping Use: Never used  Substance Use Topics   Alcohol use: No    Alcohol/week: 0.0 standard drinks of alcohol   Drug use: No   Family Status  Relation Name Status   Mother  Deceased   Father  Deceased   Other  (Not Specified)   Family History  Problem Relation Age of Onset   Muscular dystrophy Mother    Atrial fibrillation Other    Allergies  Allergen Reactions   Albuterol Shortness Of Breath    Chest pain and SOB   Budesonide-Formoterol Fumarate Shortness Of Breath    Chest pain, severe SOB    Fluticasone-Salmeterol Shortness Of Breath   Ipratropium-Albuterol Shortness Of Breath    Chest pain   Celecoxib     Other reaction(s): Kidney Disorder   Codeine Nausea Only   Nsaids Other (See Comments)    Causes kidney disease    Penicillins    Prednisone Other (See Comments)    Chest pain and tightness      Medications:  Outpatient Medications Prior to Visit  Medication Sig   acetaminophen (TYLENOL) 650 MG CR tablet Take by mouth.   apixaban (ELIQUIS) 5 MG TABS tablet Take 5 mg by mouth 2 (two) times daily.    ascorbic Acid (VITAMIN C) 500 MG CPCR Take by oral route.   bimatoprost (LATISSE) 0.03 % ophthalmic solution Place one drop on applicator and apply evenly at the base of eyelashes nightly. Repeat procedure for the other eye.   Boswellia Serrata (BOSWELLIA PO) Take by mouth. Pt takes 3 in am and 3 in pm   calcium citrate-vitamin D (CITRACAL+D) 315-200 MG-UNIT tablet Take 1 tablet by mouth daily.   Cholecalciferol (VITAMIN D3)  50 MCG (2000 UT) capsule Take 1 capsule by mouth daily. 2000 units   diltiazem (CARDIZEM) 30 MG tablet Take 30 mg by mouth as needed.   Glucosamine 500 MG CAPS Take by mouth. Pt takes 1000 mg BID   metoprolol succinate (TOPROL-XL) 25 MG 24 hr tablet Take 25 mg by mouth daily. Pt taking 1/2 tablet daily   risedronate (ACTONEL) 150 MG tablet Take 150 mg by mouth every 30 (thirty) days. with water on empty stomach, nothing by mouth or lie down for next 30 minutes. For bone density   sertraline (ZOLOFT) 25 MG tablet Take 1 tablet (25 mg total) by mouth daily.   ketoconazole (NIZORAL) 2 % shampoo Apply 1 application topically once a week. Let sit a few minutes before rinsing (Patient not taking: Reported on 12/28/2021)   MAGNESIUM PO Take by mouth.   Pilocarpine HCl 1.25 % SOLN Apply to eye.   S-Adenosylmethionine (SAM-E PO) Take by mouth. For arthritis   vitamin C (ASCORBIC ACID) 500 MG tablet Take 500 mg by mouth daily.   XIIDRA 5 % SOLN Place 1 drop into both eyes in the morning and at bedtime.   No facility-administered medications prior to visit.    Allergies  Allergen Reactions   Albuterol Shortness Of Breath    Chest pain and SOB   Budesonide-Formoterol Fumarate Shortness Of Breath    Chest pain, severe SOB    Fluticasone-Salmeterol Shortness Of Breath   Ipratropium-Albuterol Shortness Of Breath    Chest pain   Celecoxib     Other reaction(s): Kidney Disorder   Codeine Nausea Only   Nsaids Other (See Comments)    Causes kidney disease    Penicillins    Prednisone Other (See Comments)    Chest pain and tightness    Patient Care Team: Glean Hess, MD as PCP - General (Internal Medicine) Gale Journey, Gwen Her, MD as Referring Physician (Endocrinology) Derinda Sis, MD as Referring Physician (Cardiology) Dr Rana Snare, NP as Nurse Practitioner (Cardiology) Ralene Bathe, MD (Dermatology)     Objective  There were no vitals taken for this  visit.  Physical Exam No physical exam this was performed over telephone Patient speaks clearly  In no acute distress Alert and oriented at baseline   Most recent functional status assessment:    12/28/2021    1:29 PM  In your present state of health, do you have any difficulty performing the following activities:  Hearing? 0  Vision? 0  Difficulty concentrating or making decisions? 0  Walking or climbing stairs? 0  Comment some pain with arthritis  Dressing or bathing? 0  Doing errands, shopping? 0  Preparing Food and eating ? N  Using the Toilet? N  In the past six months, have you accidently leaked urine? N  Do you have problems with loss of bowel control? N  Managing your Medications? N  Managing your Finances? N  Housekeeping or managing your Housekeeping? N   Most recent fall risk assessment:    12/28/2021    1:29 PM  [place] in the past year? 0  Number falls in past yr: 0  Injury with Fall? 0  Risk for fall due to : No Fall Risks  Follow up Falls evaluation completed    Most recent depression screenings:    12/28/2021    1:29 PM 12/28/2021    1:23 PM  PHQ 2/9 Scores  PHQ - 2 Score 0 0   Most recent cognitive screening:    12/28/2021    1:31 PM  6CIT Screen  What Year? 0 points  What month? 0 points  What time? 0 points  Count back from 20 0 points  Months in reverse 0 points  Repeat phrase 0 points  Total Score 0 points   Most recent Audit-C alcohol use screening    12/28/2021    1:26 PM  Alcohol Use Disorder Test (AUDIT)  1. How often do you have a drink containing alcohol? 0  2. How many drinks containing alcohol do you have on a typical day when you are drinking? 0  3. How often do you have six or more drinks on one occasion? 0  AUDIT-C Score 0   A score of 3 or more in women, and 4 or more in men indicates increased risk for alcohol abuse, EXCEPT if all of the points are from question 1     Assessment & Plan   Annual wellness  visit done today including the all of the following: Reviewed patient's Family Medical History Reviewed and updated list of patient's medical providers Assessment of cognitive impairment was done Assessed patient's functional ability Established a written schedule for health screening [place] Completed and Reviewed  Exercise Activities and Dietary recommendations  Goals      DIET - REDUCE SODIUM INTAKE     Follow the dietary advice from her cardiac rehab, low salt, low fat        Immunization History  Administered Date(s) Administered   Fluad Quad(high Dose 65+) 02/01/2019, 02/06/2020, 02/11/2021   Influenza,inj,Quad PF,6+ Mos 05/02/2015, 03/15/2016, 01/20/2017, 01/20/2018   Influenza-Unspecified 05/05/2015   PFIZER(Purple Top)SARS-COV-2 Vaccination 07/11/2019, 08/01/2019, 03/31/2020   Pneumococcal Conjugate-13 01/15/2016   Pneumococcal Polysaccharide-23 06/13/2014, 06/12/2019   Pneumococcal-Unspecified 06/13/2014   Tdap 11/22/2014    Health Maintenance  Topic Date Due   Zoster Vaccines- Shingrix (1 of 2) Never done   COLONOSCOPY (Pts 45-53yr Insurance coverage will need to be confirmed)  Never done   COVID-19 Vaccine (4 - Booster for Pfizer series) 05/26/2020   MAMMOGRAM  03/26/2021   INFLUENZA VACCINE  12/29/2021   TETANUS/TDAP  11/21/2024   DEXA SCAN  01/08/2030   Pneumonia Vaccine 66 Years old  Completed   Hepatitis C Screening  Completed   HPV VACCINES  Aged Out     Discussed health benefits of physical activity, and encouraged her to engage in regular exercise appropriate for her age and condition.    Plan:  Need for colonoscopy (never done) discussed with patient and encouraged her to consider - will message PCP  Need for Shingles vaccine and up to date COVID-19 patient aware and is planning to get both  SApolonio Schneiders FNP

## 2021-12-28 NOTE — Patient Instructions (Addendum)
Ms. Carmen Graves , Thank you for taking time to come for your Medicare Wellness Visit. I appreciate your ongoing commitment to your health goals. Please review the following plan we discussed and let me know if I can assist you in the future.   Screening recommendations/referrals: Colonoscopy: Never done **needed** Mammogram: 02/2020 most recent- active order needs to be completed/overdue Bone Density: 01/09/2020 completed  Recommended yearly ophthalmology/optometry visit for glaucoma screening and checkup Recommended yearly dental visit for hygiene and checkup  Vaccinations: Influenza vaccine: needed in 12/2021 Pneumococcal vaccine: up to date  Tdap vaccine: next is 2026 Shingles vaccine: needed patient aware will schedule with pharmacy     Advanced directives: Needs information on completing   Conditions/risks identified: Pain due to arthritis  Next appointment: 01/11/2022 Dermatology    Preventive Care 65 Years and Older, Female Preventive care refers to lifestyle choices and visits with your health care provider that can promote health and wellness. What does preventive care include? A yearly physical exam. This is also called an annual well check. Dental exams once or twice a year. Routine eye exams. Ask your health care provider how often you should have your eyes checked. Personal lifestyle choices, including: Daily care of your teeth and gums. Regular physical activity. Eating a healthy diet. Avoiding tobacco and drug use. Limiting alcohol use. Practicing safe sex. Taking low-dose aspirin every day. Taking vitamin and mineral supplements as recommended by your health care provider. What happens during an annual well check? The services and screenings done by your health care provider during your annual well check will depend on your age, overall health, lifestyle risk factors, and family history of disease. Counseling  Your health care provider may ask you questions about  your: Alcohol use. Tobacco use. Drug use. Emotional well-being. Home and relationship well-being. Sexual activity. Eating habits. History of falls. Memory and ability to understand (cognition). Work and work Statistician. Reproductive health. Screening  You may have the following tests or measurements: Height, weight, and BMI. Blood pressure. Lipid and cholesterol levels. These may be checked every 5 years, or more frequently if you are over 87 years old. Skin check. Lung cancer screening. You may have this screening every year starting at age 96 if you have a 30-pack-year history of smoking and currently smoke or have quit within the past 15 years. Fecal occult blood test (FOBT) of the stool. You may have this test every year starting at age 51. Flexible sigmoidoscopy or colonoscopy. You may have a sigmoidoscopy every 5 years or a colonoscopy every 10 years starting at age 47. Hepatitis C blood test. Hepatitis B blood test. Sexually transmitted disease (STD) testing. Diabetes screening. This is done by checking your blood sugar (glucose) after you have not eaten for a while (fasting). You may have this done every 1-3 years. Bone density scan. This is done to screen for osteoporosis. You may have this done starting at age 44. Mammogram. This may be done every 1-2 years. Talk to your health care provider about how often you should have regular mammograms. Talk with your health care provider about your test results, treatment options, and if necessary, the need for more tests. Vaccines  Your health care provider may recommend certain vaccines, such as: Influenza vaccine. This is recommended every year. Tetanus, diphtheria, and acellular pertussis (Tdap, Td) vaccine. You may need a Td booster every 10 years. Zoster vaccine. You may need this after age 15. Pneumococcal 13-valent conjugate (PCV13) vaccine. One dose is recommended after age  65. Pneumococcal polysaccharide (PPSV23) vaccine.  One dose is recommended after age 46. Talk to your health care provider about which screenings and vaccines you need and how often you need them. This information is not intended to replace advice given to you by your health care provider. Make sure you discuss any questions you have with your health care provider. Document Released: 06/13/2015 Document Revised: 02/04/2016 Document Reviewed: 03/18/2015 Elsevier Interactive Patient Education  2017 [place] Prevention in the Home Falls can cause injuries. They can happen to people of all ages. There are many things you can do to make your home safe and to help prevent falls. What can I do on the outside of my home? Regularly fix the edges of walkways and driveways and fix any cracks. Remove anything that might make you trip as you walk through a door, such as a raised step or threshold. Trim any bushes or trees on the path to your home. Use bright outdoor lighting. Clear any walking paths of anything that might make someone trip, such as rocks or tools. Regularly check to see if handrails are loose or broken. Make sure that both sides of any steps have handrails. Any raised decks and porches should have guardrails on the edges. Have any leaves, snow, or ice cleared regularly. Use sand or salt on walking paths during winter. Clean up any spills in your garage right away. This includes oil or grease spills. What can I do in the bathroom? Use night lights. Install grab bars by the toilet and in the tub and shower. Do not use towel bars as grab bars. Use non-skid mats or decals in the tub or shower. If you need to sit down in the shower, use a plastic, non-slip stool. Keep the floor dry. Clean up any water that spills on the floor as soon as it happens. Remove soap buildup in the tub or shower regularly. Attach bath mats securely with double-sided non-slip rug tape. Do not have throw rugs and other things on the floor that can make  you trip. What can I do in the bedroom? Use night lights. Make sure that you have a light by your bed that is easy to reach. Do not use any sheets or blankets that are too big for your bed. They should not hang down onto the floor. Have a firm chair that has side arms. You can use this for support while you get dressed. Do not have throw rugs and other things on the floor that can make you trip. What can I do in the kitchen? Clean up any spills right away. Avoid walking on wet floors. Keep items that you use a lot in easy-to-reach places. If you need to reach something above you, use a strong step stool that has a grab bar. Keep electrical cords out of the way. Do not use floor polish or wax that makes floors slippery. If you must use wax, use non-skid floor wax. Do not have throw rugs and other things on the floor that can make you trip. What can I do with my stairs? Do not leave any items on the stairs. Make sure that there are handrails on both sides of the stairs and use them. Fix handrails that are broken or loose. Make sure that handrails are as long as the stairways. Check any carpeting to make sure that it is firmly attached to the stairs. Fix any carpet that is loose or worn. Avoid having throw rugs at  the top or bottom of the stairs. If you do have throw rugs, attach them to the floor with carpet tape. Make sure that you have a light switch at the top of the stairs and the bottom of the stairs. If you do not have them, ask someone to add them for you. What else can I do to help prevent falls? Wear shoes that: Do not have high heels. Have rubber bottoms. Are comfortable and fit you well. Are closed at the toe. Do not wear sandals. If you use a stepladder: Make sure that it is fully opened. Do not climb a closed stepladder. Make sure that both sides of the stepladder are locked into place. Ask someone to hold it for you, if possible. Clearly mark and make sure that you can  see: Any grab bars or handrails. First and last steps. Where the edge of each step is. Use tools that help you move around (mobility aids) if they are needed. These include: Canes. Walkers. Scooters. Crutches. Turn on the lights when you go into a dark area. Replace any light bulbs as soon as they burn out. Set up your furniture so you have a clear path. Avoid moving your furniture around. If any of your floors are uneven, fix them. If there are any pets around you, be aware of where they are. Review your medicines with your doctor. Some medicines can make you feel dizzy. This can increase your chance of falling. Ask your doctor what other things that you can do to help prevent falls. This information is not intended to replace advice given to you by your health care provider. Make sure you discuss any questions you have with your health care provider. Document Released: 03/13/2009 Document Revised: 10/23/2015 Document Reviewed: 06/21/2014 Elsevier Interactive Patient Education  2017 [employer].

## 2022-01-11 ENCOUNTER — Ambulatory Visit: Payer: Medicare Other | Admitting: Dermatology

## 2022-01-11 LAB — COLOGUARD: COLOGUARD: POSITIVE — AB

## 2022-01-12 ENCOUNTER — Other Ambulatory Visit: Payer: Self-pay

## 2022-01-12 ENCOUNTER — Telehealth: Payer: Self-pay | Admitting: Internal Medicine

## 2022-01-12 DIAGNOSIS — R195 Other fecal abnormalities: Secondary | ICD-10-CM

## 2022-01-12 NOTE — Telephone Encounter (Signed)
Copied from [place] [id number]. Topic: General - Call Back - No Documentation >> Jan 12, 2022  2:32 PM Oley Balm E wrote: Reason for CRM: Pt called requesting to speak to [facility].

## 2022-01-13 LAB — HM DEXA SCAN

## 2022-01-13 NOTE — Telephone Encounter (Signed)
Called pt spoke to her about her referral that was placed on 8/15. Told pt it could take up to 7 days for someone to call to schedule an appointment. Pt confirmed that the referral was placed to the right provider and verbalized understanding.  KP

## 2022-01-15 ENCOUNTER — Other Ambulatory Visit: Payer: Self-pay

## 2022-01-15 DIAGNOSIS — M1990 Unspecified osteoarthritis, unspecified site: Secondary | ICD-10-CM

## 2022-02-10 ENCOUNTER — Encounter: Payer: Self-pay | Admitting: Internal Medicine

## 2022-02-11 ENCOUNTER — Encounter: Payer: Self-pay | Admitting: Internal Medicine

## 2022-02-11 ENCOUNTER — Ambulatory Visit (INDEPENDENT_AMBULATORY_CARE_PROVIDER_SITE_OTHER): Payer: Medicare Other | Admitting: Internal Medicine

## 2022-02-11 VITALS — BP 118/70 | HR 73 | Ht 64.0 in | Wt 135.2 lb

## 2022-02-11 DIAGNOSIS — M810 Age-related osteoporosis without current pathological fracture: Secondary | ICD-10-CM

## 2022-02-11 DIAGNOSIS — E785 Hyperlipidemia, unspecified: Secondary | ICD-10-CM

## 2022-02-11 DIAGNOSIS — I421 Obstructive hypertrophic cardiomyopathy: Secondary | ICD-10-CM

## 2022-02-11 DIAGNOSIS — J069 Acute upper respiratory infection, unspecified: Secondary | ICD-10-CM

## 2022-02-11 DIAGNOSIS — Z23 Encounter for immunization: Secondary | ICD-10-CM

## 2022-02-11 DIAGNOSIS — F411 Generalized anxiety disorder: Secondary | ICD-10-CM

## 2022-02-11 DIAGNOSIS — Z1231 Encounter for screening mammogram for malignant neoplasm of breast: Secondary | ICD-10-CM

## 2022-02-11 DIAGNOSIS — K5901 Slow transit constipation: Secondary | ICD-10-CM | POA: Insufficient documentation

## 2022-02-11 DIAGNOSIS — D6869 Other thrombophilia: Secondary | ICD-10-CM

## 2022-02-11 MED ORDER — SERTRALINE HCL 25 MG PO TABS: 25.0000 mg | ORAL_TABLET | Freq: Every day | ORAL | 3 refills | Status: DC

## 2022-02-11 MED ORDER — LINACLOTIDE 145 MCG PO CAPS: 145.0000 ug | ORAL_CAPSULE | Freq: Every day | ORAL | 1 refills | Status: DC

## 2022-02-11 NOTE — Progress Notes (Signed)
Date:  02/11/2022   Name:  Carmen Graves   DOB:  21-Aug-1955   MRN:  [id number]   Chief Complaint: Annual Exam Carmen Graves is a 66 y.o. female who presents today for her Complete Annual Exam. She feels well. She reports exercising- walking 1-2 miles daily. She reports she is sleeping well. Breast complaints - none. She has a jury duty summons and would like to be excused for her heart condition.  Mammogram: 02/2020 DEXA: 12/2021 osteopenia hip/OP wrist Pap smear: discontinued Colonoscopy: 01/2022 7 polyps TA  Health Maintenance Due  Topic Date Due   MAMMOGRAM  03/26/2021   INFLUENZA VACCINE  12/29/2021    Immunization History  Administered Date(s) Administered   Fluad Quad(high Dose 65+) 02/01/2019, 02/06/2020, 02/11/2021   Influenza,inj,Quad PF,6+ Mos 05/02/2015, 03/15/2016, 01/20/2017, 01/20/2018   Influenza-Unspecified 05/05/2015   PFIZER(Purple Top)SARS-COV-2 Vaccination 07/11/2019, 08/01/2019, 03/31/2020   Pneumococcal Conjugate-13 01/15/2016   Pneumococcal Polysaccharide-23 06/13/2014, 06/12/2019   Pneumococcal-Unspecified 06/13/2014   Tdap 11/22/2014    Depression        This is a chronic problem.The problem is unchanged.  Associated symptoms include no fatigue and no headaches.  Past treatments include SSRIs - Selective serotonin reuptake inhibitors.  Compliance with treatment is good. Hyperlipidemia This is a chronic problem. The problem is uncontrolled. Pertinent negatives include no chest pain or shortness of breath. Current antihyperlipidemic treatment includes diet change.  Cough This is a new problem. The current episode started in the past 7 days. The problem has been unchanged. The problem occurs hourly. The cough is Productive of sputum. Pertinent negatives include no chest pain, chills, fever, headaches, rash, shortness of breath or wheezing.  Constipation This is a chronic problem. The problem is unchanged. Her stool frequency is 1 time per week or less.  The stool is described as pellet like. The patient is not on a high fiber diet. She Exercises regularly. There has Been adequate water intake. Pertinent negatives include no abdominal pain, diarrhea, fever or vomiting.  OP - followed by [facility]; on Actonel with calcium and vitamin D.   Lab Results  Component Value Date   NA 142 02/11/2021   K 4.7 02/11/2021   CO2 23 02/11/2021   GLUCOSE 90 02/11/2021   BUN 14 02/11/2021   CREATININE 0.63 02/11/2021   CALCIUM 9.3 02/11/2021   EGFR 97 02/11/2021   GFRNONAA 93 02/06/2020   Lab Results  Component Value Date   CHOL 214 (H) 02/11/2021   HDL 45 02/11/2021   LDLCALC 148 (H) 02/11/2021   TRIG 119 02/11/2021   CHOLHDL 4.8 (H) 02/11/2021   Lab Results  Component Value Date   TSH 0.890 02/11/2021   No results found for: "HGBA1C" Lab Results  Component Value Date   WBC 4.7 02/11/2021   HGB 13.4 02/11/2021   HCT 41.4 02/11/2021   MCV 93 02/11/2021   PLT 216 02/11/2021   Lab Results  Component Value Date   ALT 22 02/11/2021   AST 24 02/11/2021   ALKPHOS 67 02/11/2021   BILITOT 0.4 02/11/2021   Lab Results  Component Value Date   VD25OH 60.8 02/06/2020     Review of Systems  Constitutional:  Negative for chills, fatigue and fever.  HENT:  Negative for congestion, hearing loss, tinnitus, trouble swallowing and voice change.   Eyes:  Negative for visual disturbance.  Respiratory:  Positive for cough. Negative for chest tightness, shortness of breath and wheezing.   Cardiovascular:  Negative for chest  pain, palpitations and leg swelling.  Gastrointestinal:  Positive for constipation. Negative for abdominal pain, diarrhea and vomiting.  Endocrine: Negative for polydipsia and polyuria.  Genitourinary:  Negative for dysuria, frequency, genital sores, vaginal bleeding and vaginal discharge.  Musculoskeletal:  Negative for arthralgias, gait problem and joint swelling.  Skin:  Negative for color change and rash.  Neurological:   Negative for dizziness, tremors, light-headedness and headaches.  Hematological:  Negative for adenopathy. Does not bruise/bleed easily.  Psychiatric/Behavioral:  Positive for depression. Negative for dysphoric mood and sleep disturbance. The patient is not nervous/anxious.     Patient Active Problem List   Diagnosis Date Noted   Slow transit constipation 02/11/2022   Vitamin D deficiency 09/21/2021   Iron deficiency anemia 09/21/2021   Displacement of cervical intervertebral disc 09/21/2021   Acquired thrombophilia ([place]) 02/11/2021   Need for prophylactic antibiotic 04/08/2016   Abnormal genetic test 05/08/2015   Presence of automatic cardioverter/defibrillator (AICD) 03/13/2015   Major depression single episode, in partial remission ([place]) 03/07/2015   Hyperlipidemia, mild 03/05/2015   Osteoporosis 03/05/2015   Hypertrophic obstructive cardiomyopathy ([place]) 02/27/2015    Allergies  Allergen Reactions   Albuterol Shortness Of Breath    Chest pain and SOB   Budesonide-Formoterol Fumarate Shortness Of Breath    Chest pain, severe SOB    Fluticasone-Salmeterol Shortness Of Breath   Ipratropium-Albuterol Shortness Of Breath    Chest pain   Celecoxib     Other reaction(s): Kidney Disorder   Codeine Nausea Only   Nsaids Other (See Comments)    Causes kidney disease    Penicillins    Prednisone Other (See Comments)    Chest pain and tightness    Past Surgical History:  Procedure Laterality Date   BONY PELVIS SURGERY     tore muscle from pelvic bone   BREAST BIOPSY Bilateral 15+ YRS AGO   NEG   BREAST CYST ASPIRATION Bilateral    CARDIAC DEFIBRILLATOR PLACEMENT  01/2015   for HOCM   MYOMECTOMY  06/2015   TONSILLECTOMY AND ADENOIDECTOMY     UMBILICAL HERNIA REPAIR      Social History   Tobacco Use   Smoking status: Never   Smokeless tobacco: Never  Vaping Use   Vaping Use: Never used  Substance Use Topics   Alcohol use: No    Alcohol/week: 0.0 standard drinks of  alcohol   Drug use: No     Medication list has been reviewed and updated.  Current Meds  Medication Sig   acetaminophen (TYLENOL) 650 MG CR tablet Take by mouth.   apixaban (ELIQUIS) 5 MG TABS tablet Take 5 mg by mouth 2 (two) times daily.    Boswellia Serrata (BOSWELLIA PO) Take by mouth. Pt takes 3 in am and 3 in pm   calcium citrate-vitamin D (CITRACAL+D) 315-200 MG-UNIT tablet Take 1 tablet by mouth daily.   Cholecalciferol (VITAMIN D3) 50 MCG (2000 UT) capsule Take 1 capsule by mouth daily. 2000 units   diltiazem (CARDIZEM) 30 MG tablet Take 30 mg by mouth as needed.   Glucosamine 500 MG CAPS Take by mouth. Pt takes 1000 mg BID   MAGNESIUM PO Take by mouth.   metoprolol succinate (TOPROL-XL) 25 MG 24 hr tablet Take 25 mg by mouth daily. Pt taking 1/2 tablet daily   risedronate (ACTONEL) 150 MG tablet Take 150 mg by mouth every 30 (thirty) days. with water on empty stomach, nothing by mouth or lie down for next 30 minutes. For bone density  S-Adenosylmethionine (SAM-E PO) Take by mouth. For arthritis   sertraline (ZOLOFT) 25 MG tablet Take 1 tablet (25 mg total) by mouth daily.   vitamin C (ASCORBIC ACID) 500 MG tablet Take 500 mg by mouth daily.   XIIDRA 5 % SOLN Place 1 drop into both eyes in the morning and at bedtime.       02/11/2022    8:57 AM 02/11/2021    8:24 AM 02/06/2020    8:48 AM  GAD 7 : Generalized Anxiety Score  Nervous, Anxious, on Edge 2 3 0  Control/stop worrying 0 3 1  Worry too much - different things 0 3 1  Trouble relaxing 0 3 0  Restless 0 3 0  Easily annoyed or irritable 0 3 0  Afraid - awful might happen 0 3 0  Total GAD 7 Score _0 Anxiety Difficulty Not difficult at all  Not difficult at all       02/11/2022    8:56 AM 12/28/2021    1:29 PM 12/28/2021    1:23 PM  Depression screen PHQ 2/9  Decreased Interest 0 0 0  Down, Depressed, Hopeless 0 0 0  PHQ - 2 Score 0 0 0  Altered sleeping 0    Tired, decreased energy 0    Change in  appetite 0    Feeling bad or failure about yourself  0    Trouble concentrating 0    Moving slowly or fidgety/restless 0    Suicidal thoughts 0    PHQ-9 Score 0    Difficult doing work/chores Not difficult at all      BP Readings from Last 3 Encounters:  02/11/22 118/70  02/11/21 122/72  02/06/20 112/74    Physical Exam Vitals and nursing note reviewed.  Constitutional:      General: She is not in acute distress.    Appearance: She is well-developed.  HENT:     Head: Normocephalic and atraumatic.     Right Ear: Tympanic membrane and ear canal normal.     Left Ear: Tympanic membrane and ear canal normal.     Nose:     Right Sinus: No maxillary sinus tenderness.     Left Sinus: No maxillary sinus tenderness.     Mouth/Throat:     Pharynx: No oropharyngeal exudate or posterior oropharyngeal erythema.  Eyes:     General: No scleral icterus.       Right eye: No discharge.        Left eye: No discharge.     Conjunctiva/sclera: Conjunctivae normal.  Neck:     Thyroid: No thyromegaly.     Vascular: No carotid bruit.  Cardiovascular:     Rate and Rhythm: Normal rate and regular rhythm.     Pulses: Normal pulses.     Heart sounds: Normal heart sounds.  Pulmonary:     Effort: Pulmonary effort is normal. No respiratory distress.     Breath sounds: No wheezing or rhonchi.  Chest:  Breasts:    Right: No mass, nipple discharge, skin change or tenderness.     Left: No mass, nipple discharge, skin change or tenderness.  Abdominal:     General: Bowel sounds are normal.     Palpations: Abdomen is soft.     Tenderness: There is no abdominal tenderness.  Musculoskeletal:     Cervical back: Normal range of motion. No erythema.     Right lower leg: No edema.     Left lower leg:  No edema.  Lymphadenopathy:     Cervical: No cervical adenopathy.  Skin:    General: Skin is warm and dry.     Findings: No rash.  Neurological:     Mental Status: She is alert and oriented to person,  place, and time.     Cranial Nerves: No cranial nerve deficit.     Sensory: No sensory deficit.     Deep Tendon Reflexes: Reflexes are normal and symmetric.  Psychiatric:        Attention and Perception: Attention normal.        Mood and Affect: Mood normal.     Wt Readings from Last 3 Encounters:  02/11/22 135 lb 3.2 oz (61.3 kg)  02/11/21 137 lb (62.1 kg)  02/06/20 137 lb (62.1 kg)    BP 118/70   Pulse 73   Ht _0  (1.626 m)   Wt 135 lb 3.2 oz (61.3 kg)   SpO2 98%   BMI 23.21 kg/m   Assessment and Plan: 1. Hypertrophic obstructive cardiomyopathy ([place]) S/p myomectomy Doing well on current therapy Monitored closely by Cardiology - CBC with Differential/Platelet - Comprehensive metabolic panel  2. Encounter for screening mammogram for breast cancer Overdue for screening - MM 3D SCREEN BREAST BILATERAL  3. Osteoporosis, unspecified osteoporosis type, unspecified pathological fracture presence Followed by [facility] Dr. Gale Journey On Actonel; continue calcium and vitamin D - VITAMIN D 25 Hydroxy (Vit-D Deficiency, Fractures)  4. Acquired thrombophilia ([place]) DOAC therapy - no bleeding issues noted - CBC with Differential/Platelet  5. Hyperlipidemia, mild Check labs - Lipid panel  6. Generalized anxiety disorder Doing very well on Sertraline low dose.  No SI/HI or depressive symptoms  - TSH - sertraline (ZOLOFT) 25 MG tablet; Take 1 tablet (25 mg total) by mouth daily.  Dispense: 90 tablet; Refill: 3  7. Slow transit constipation Noted by GI at the time of her colonoscopy She takes Senna-S daily in order to remain regular. Stop senna and try Linzess. - linaclotide (LINZESS) 145 MCG CAPS capsule; Take 1 capsule (145 mcg total) by mouth daily before breakfast.  Dispense: 30 capsule; Refill: 1  8. Need for immunization against influenza - Flu Vaccine QUAD High Dose(Fluad)  9. Upper respiratory tract infection, unspecified type Appears to be mild, viral without worsening  symptoms Continue supportive care with tylenol, Flonase spray No indication for antibiotics at this time   Partially dictated using Editor, commissioning. Any errors are unintentional.  Halina Maidens, MD [facility]  02/11/2022

## 2022-02-12 LAB — CBC WITH DIFFERENTIAL/PLATELET
Basophils Absolute: 0.1 10*3/uL (ref 0.0–0.2)
Basos: 1 %
EOS (ABSOLUTE): 0 10*3/uL (ref 0.0–0.4)
Eos: 1 %
Hematocrit: 40.6 % (ref 34.0–46.6)
Hemoglobin: 12.9 g/dL (ref 11.1–15.9)
Immature Grans (Abs): 0 10*3/uL (ref 0.0–0.1)
Immature Granulocytes: 0 %
Lymphocytes Absolute: 1.2 10*3/uL (ref 0.7–3.1)
Lymphs: 23 %
MCH: 29.7 pg (ref 26.6–33.0)
MCHC: 31.8 g/dL (ref 31.5–35.7)
MCV: 93 fL (ref 79–97)
Monocytes Absolute: 0.3 10*3/uL (ref 0.1–0.9)
Monocytes: 6 %
Neutrophils Absolute: 3.5 10*3/uL (ref 1.4–7.0)
Neutrophils: 69 %
Platelets: 222 10*3/uL (ref 150–450)
RBC: 4.35 x10E6/uL (ref 3.77–5.28)
RDW: 12.8 % (ref 11.7–15.4)
WBC: 5.1 10*3/uL (ref 3.4–10.8)

## 2022-02-12 LAB — COMPREHENSIVE METABOLIC PANEL
ALT: 15 IU/L (ref 0–32)
AST: 19 IU/L (ref 0–40)
Albumin/Globulin Ratio: 1.9 (ref 1.2–2.2)
Albumin: 4.2 g/dL (ref 3.9–4.9)
Alkaline Phosphatase: 69 IU/L (ref 44–121)
BUN/Creatinine Ratio: 19 (ref 12–28)
BUN: 12 mg/dL (ref 8–27)
Bilirubin Total: 0.6 mg/dL (ref 0.0–1.2)
CO2: 20 mmol/L (ref 20–29)
Calcium: 9.2 mg/dL (ref 8.7–10.3)
Chloride: 105 mmol/L (ref 96–106)
Creatinine, Ser: 0.62 mg/dL (ref 0.57–1.00)
Globulin, Total: 2.2 g/dL (ref 1.5–4.5)
Glucose: 88 mg/dL (ref 70–99)
Potassium: 4.4 mmol/L (ref 3.5–5.2)
Sodium: 141 mmol/L (ref 134–144)
Total Protein: 6.4 g/dL (ref 6.0–8.5)
eGFR: 97 mL/min/{1.73_m2} (ref >59)

## 2022-02-12 LAB — TSH: TSH: 0.692 u[IU]/mL (ref 0.450–4.500)

## 2022-02-12 LAB — LIPID PANEL
Chol/HDL Ratio: 4.9 ratio — ABNORMAL HIGH (ref 0.0–4.4)
Cholesterol, Total: 209 mg/dL — ABNORMAL HIGH (ref 100–199)
HDL: 43 mg/dL (ref >39)
LDL Chol Calc (NIH): 140 mg/dL — ABNORMAL HIGH (ref 0–99)
Triglycerides: 147 mg/dL (ref 0–149)
VLDL Cholesterol Cal: 26 mg/dL (ref 5–40)

## 2022-02-12 LAB — VITAMIN D 25 HYDROXY (VIT D DEFICIENCY, FRACTURES): Vit D, 25-Hydroxy: 42.6 ng/mL (ref 30.0–100.0)

## 2022-03-09 ENCOUNTER — Ambulatory Visit
Admission: RE | Admit: 2022-03-09 | Discharge: 2022-03-09 | Disposition: A | Discharge status: Home / Self Care | Payer: Medicare Other | Source: Ambulatory Visit | Attending: Internal Medicine | Admitting: Internal Medicine

## 2022-03-09 DIAGNOSIS — Z1231 Encounter for screening mammogram for malignant neoplasm of breast: Secondary | ICD-10-CM | POA: Diagnosis present

## 2022-05-12 ENCOUNTER — Ambulatory Visit: Payer: Medicare Other

## 2022-05-13 ENCOUNTER — Ambulatory Visit: Payer: Medicare Other | Admitting: Dermatology

## 2022-07-09 ENCOUNTER — Ambulatory Visit: Payer: Medicare Other

## 2022-07-09 ENCOUNTER — Ambulatory Visit (LOCAL_COMMUNITY_HEALTH_CENTER): Payer: Medicare Other

## 2022-07-09 DIAGNOSIS — Z23 Encounter for immunization: Secondary | ICD-10-CM

## 2022-07-09 DIAGNOSIS — Z719 Counseling, unspecified: Secondary | ICD-10-CM

## 2022-07-09 NOTE — Progress Notes (Signed)
  Are you feeling sick today? No   Have you ever received a dose of COVID-19 Vaccine? [employer], [place], [place], [place], Other) Yes  If yes, which vaccine and how many doses?   PFIZER, 4   Did you bring the vaccination record card or other documentation?  Yes   Do you have a health condition or are undergoing treatment that makes you moderately or severely immunocompromised? This would include, but not be limited to: cancer, HIV, organ transplant, immunosuppressive therapy/high-dose corticosteroids, or moderate/severe primary immunodeficiency.  No  Have you received COVID-19 vaccine before or during hematopoietic cell transplant (HCT) or CAR-T-cell therapies? No  Have you ever had an allergic reaction to: (This would include a severe allergic reaction or a reaction that caused hives, swelling, or respiratory distress, including wheezing.) A component of a COVID-19 vaccine or a previous dose of COVID-19 vaccine? No   Have you ever had an allergic reaction to another vaccine (other thanCOVID-19 vaccine) or an injectable medication? (This would include a severe allergic reaction or a reaction that caused hives, swelling, or respiratory distress, including wheezing.)   No    Do you have a history of any of the following:  Myocarditis or Pericarditis No  Dermal fillers:  No  Multisystem Inflammatory Syndrome (MIS-C or MIS-A)? No  COVID-19 disease within the past 3 months? No  Vaccinated with monkeypox vaccine in the last 4 weeks? No  Eligible, administered [place] 12y+, yr 2023-2024 per pt request. Monitored, tolerated well. Provided VIS and  NCIR copy. M.Sharif Rendell, LPN.

## 2022-07-28 ENCOUNTER — Other Ambulatory Visit: Payer: Self-pay

## 2022-07-28 ENCOUNTER — Telehealth: Payer: Self-pay | Admitting: Internal Medicine

## 2022-07-28 DIAGNOSIS — K5901 Slow transit constipation: Secondary | ICD-10-CM

## 2022-07-28 NOTE — Telephone Encounter (Signed)
Copied from [place] [id number]. Topic: Referral - Request for Referral >> Jul 28, 2022 10:42 AM Chapman Fitch wrote: Has patient seen PCP for this complaint? Yes / discussed in sept 2023 *If NO, is insurance requiring patient see PCP for this issue before PCP can refer them? Referral for which specialty: Gertie Fey  Preferred provider/office: [facility] / female provider  Reason for referral: pt had colonoscopy and they recommended a f/u with GI provider / please advise

## 2022-07-28 NOTE — Telephone Encounter (Signed)
Referral placed to Dr Marius Ditch. Patient informed.   Carmen Graves

## 2022-07-30 HISTORY — PX: BLEPHAROPLASTY: SUR158

## 2022-09-02 ENCOUNTER — Ambulatory Visit: Payer: Medicare Other | Admitting: Dermatology

## 2022-09-21 ENCOUNTER — Telehealth: Payer: Self-pay | Admitting: Internal Medicine

## 2022-09-21 NOTE — Telephone Encounter (Signed)
We will contact patient when forms are complete and ready for pick up.  Carmen Graves

## 2022-09-21 NOTE — Telephone Encounter (Signed)
Copied from CRM [id number]. Topic: General - Other >> Sep 21, 2022  1:37 PM [place] B wrote: Reason for CRM: patient called in about paperwork dropped off yesterday, for [facility]mv. She wants to know when she can pick this up.

## 2022-09-22 ENCOUNTER — Telehealth: Payer: Self-pay | Admitting: Internal Medicine

## 2022-09-22 NOTE — Telephone Encounter (Signed)
Copied from CRM [id number]. Topic: General - Other >> Sep 22, 2022  2:05 PM Dominique A wrote: Reason for CRM: Pt is calling wanting to speak with Chassidy. Pt is wanting to check on the form that she left on Monday and is wanting to see if it is ready. Please have Chassidy call pt back.

## 2022-09-22 NOTE — Telephone Encounter (Signed)
Called pt and informed paperwork is ready.  - Carmen Graves

## 2022-12-09 ENCOUNTER — Ambulatory Visit: Payer: Medicare Other | Admitting: Dermatology

## 2023-01-04 LAB — BASIC METABOLIC PANEL
BUN: 21 (ref 4–21)
CO2: 23 — AB (ref 13–22)
Chloride: 105 (ref 99–108)
Creatinine: 0.8 (ref 0.5–1.1)
Glucose: 90
Potassium: 4.4 meq/L (ref 3.5–5.1)
Sodium: 139 (ref 137–147)

## 2023-01-04 LAB — CBC AND DIFFERENTIAL
HCT: 43 (ref 36–46)
Hemoglobin: 13.7 (ref 12.0–16.0)
Platelets: 232 10*3/uL (ref 150–400)
WBC: 7.3

## 2023-01-04 LAB — HEPATIC FUNCTION PANEL
ALT: 29 U/L (ref 7–35)
AST: 25 (ref 13–35)
Alkaline Phosphatase: 64 (ref 25–125)
Bilirubin, Total: 0.7

## 2023-01-04 LAB — TSH: TSH: 0.81 (ref 0.41–5.90)

## 2023-01-04 LAB — COMPREHENSIVE METABOLIC PANEL
Calcium: 9 (ref 8.7–10.7)
eGFR: 80

## 2023-01-06 ENCOUNTER — Telehealth: Payer: Self-pay

## 2023-01-06 NOTE — Transitions of Care (Post Inpatient/ED Visit) (Signed)
   01/06/2023  Name: Carmen Graves MRN: [id number] DOB: 1956-05-08  Today's TOC FU Call Status: Today's TOC FU Call Status:: Successful TOC FU Call Completed TOC FU Call Complete Date: 01/06/23  Transition Care Management Follow-up Telephone Call Date of Discharge: 01/05/23 Name of Other ([facility]) Discharge Facility: [facility] Type of Discharge: Emergency Department How have you been since you were released from the hospital?: Better Any questions or concerns?: No  Items Reviewed: Did you receive and understand the discharge instructions provided?: Yes Medications obtained,verified, and reconciled?: Yes (Medications Reviewed) Any new allergies since your discharge?: No Dietary orders reviewed?: No Do you have support at home?: Yes  Medications Reviewed Today: Medications Reviewed Today   Medications were not reviewed in this encounter     Home Care and Equipment/Supplies: Were Home Health Services Ordered?: No Any new equipment or medical supplies ordered?: No  Functional Questionnaire: Do you need assistance with bathing/showering or dressing?: No Do you need assistance with meal preparation?: No Do you need assistance with eating?: No Do you have difficulty maintaining continence: No Do you need assistance with getting out of bed/getting out of a chair/moving?: No Do you have difficulty managing or taking your medications?: No  Follow up appointments reviewed: PCP Follow-up appointment confirmed?: No (declined) Specialist Hospital Follow-up appointment confirmed?: Yes Date of Specialist follow-up appointment?: 01/10/23 Do you need transportation to your follow-up appointment?: No Do you understand care options if your condition(s) worsen?: Yes-patient verbalized understanding    SIGNATURE tb,cma

## 2023-01-11 ENCOUNTER — Telehealth: Payer: Self-pay | Admitting: Internal Medicine

## 2023-01-11 NOTE — Telephone Encounter (Signed)
Copied from CRM [id number]. Topic: Medicare AWV >> Jan 11, 2023  9:20 AM Payton Doughty wrote: Reason for CRM: LVM 01/11/23 to r/s appt today due to [facility] called out sick. New AWV date 01/12/23 at 2:30pm. Please confirm date change khc.  Verlee Rossetti; Care Guide Ambulatory Clinical Support [place] [facility] Direct Dial: [phone]

## 2023-01-12 ENCOUNTER — Ambulatory Visit (INDEPENDENT_AMBULATORY_CARE_PROVIDER_SITE_OTHER): Payer: Medicare Other

## 2023-01-12 VITALS — Ht 64.0 in | Wt 135.0 lb

## 2023-01-12 DIAGNOSIS — Z0000 Encounter for general adult medical examination without abnormal findings: Secondary | ICD-10-CM | POA: Diagnosis not present

## 2023-01-12 NOTE — Progress Notes (Addendum)
Subjective:   Carmen Graves is a 67 y.o. female who presents for Medicare Annual (Subsequent) preventive examination.  Visit Complete: Virtual  I connected with  Carmen Graves on 01/12/23 by a audio enabled telemedicine application and verified that I am speaking with the correct person using two identifiers.  Patient Location: Home  Provider Location: Office/Clinic  I discussed the limitations of evaluation and management by telemedicine. The patient expressed understanding and agreed to proceed.  Patient Medicare AWV questionnaire was completed by the patient on (not done); I have confirmed that all information answered by patient is correct and no changes since this date.  Review of Systems         Objective:    There were no vitals filed for this visit. There is no height or weight on file to calculate BMI.     12/28/2021    1:27 PM 12/24/2020   12:45 PM 11/28/2019    1:53 PM 01/15/2016    8:57 AM 07/18/2015    9:12 AM  Advanced Directives  Does Patient Have a Medical Advance Directive? No No No No No  Does patient want to make changes to medical advance directive? Yes (MAU/Ambulatory/Procedural Areas - Information given)  No - Patient declined    Would patient like information on creating a medical advance directive? Yes (MAU/Ambulatory/Procedural Areas - Information given) No - Patient declined  Yes - Educational materials given No - patient declined information    Current Medications (verified) Outpatient Encounter Medications as of 01/12/2023  Medication Sig   acetaminophen (TYLENOL) 650 MG CR tablet Take by mouth.   apixaban (ELIQUIS) 5 MG TABS tablet Take 5 mg by mouth 2 (two) times daily.    Boswellia Serrata (BOSWELLIA PO) Take by mouth. Pt takes 3 in am and 3 in pm   calcium citrate-vitamin D (CITRACAL+D) 315-200 MG-UNIT tablet Take 1 tablet by mouth daily.   Cholecalciferol (VITAMIN D3) 50 MCG (2000 UT) capsule Take 1 capsule by mouth daily. 2000 units    diltiazem (CARDIZEM) 30 MG tablet Take 30 mg by mouth as needed.   Glucosamine 500 MG CAPS Take by mouth. Pt takes 1000 mg BID   linaclotide (LINZESS) 145 MCG CAPS capsule Take 1 capsule (145 mcg total) by mouth daily before breakfast.   MAGNESIUM PO Take by mouth.   metoprolol succinate (TOPROL-XL) 25 MG 24 hr tablet Take 25 mg by mouth daily. Pt taking 1/2 tablet daily   risedronate (ACTONEL) 150 MG tablet Take 150 mg by mouth every 30 (thirty) days. with water on empty stomach, nothing by mouth or lie down for next 30 minutes. For bone density   S-Adenosylmethionine (SAM-E PO) Take by mouth. For arthritis   sertraline (ZOLOFT) 25 MG tablet Take 1 tablet (25 mg total) by mouth daily.   vitamin C (ASCORBIC ACID) 500 MG tablet Take 500 mg by mouth daily.   XIIDRA 5 % SOLN Place 1 drop into both eyes in the morning and at bedtime.   No facility-administered encounter medications on file as of 01/12/2023.    Allergies (verified) Albuterol, Budesonide-formoterol fumarate, Fluticasone-salmeterol, Ipratropium-albuterol, Celecoxib, Codeine, Nsaids, Penicillins, and Prednisone   History: Past Medical History:  Diagnosis Date   A-fib (HCC)    Asthma    CHF (congestive heart failure), NYHA class III, chronic, diastolic (HCC) 03/26/2016   Hyperlipidemia    Hypertrophic obstructive cardiomyopathy (HCC)    Neurocardiogenic syncope 02/23/2015   Osteoarthrosis    Osteopenia    Palpitations  Presence of automatic cardioverter/defibrillator (AICD) 03/13/2015   Renal insufficiency    Status post cardiac surgery 03/31/2016   Ventriculomyomectomy, MV revision   Status post cardiac surgery 04/08/2016   Vitamin B deficiency    Past Surgical History:  Procedure Laterality Date   BONY PELVIS SURGERY     tore muscle from pelvic bone   BREAST BIOPSY Bilateral 15+ YRS AGO   NEG   BREAST CYST ASPIRATION Bilateral    CARDIAC DEFIBRILLATOR PLACEMENT  01/2015   for HOCM   MYOMECTOMY  06/2015    TONSILLECTOMY AND ADENOIDECTOMY     UMBILICAL HERNIA REPAIR     Family History  Problem Relation Age of Onset   Muscular dystrophy Mother    Atrial fibrillation Other    Breast cancer Neg Hx    Social History   Socioeconomic History   Marital status: Married    Spouse name: Not on file   Number of children: 2   Years of education: Not on file   Highest education level: Not on file  Occupational History   Occupation: retired  Tobacco Use   Smoking status: Never   Smokeless tobacco: Never  Vaping Use   Vaping status: Never Used  Substance and Sexual Activity   Alcohol use: No    Alcohol/week: 0.0 standard drinks of alcohol   Drug use: No   Sexual activity: Yes  Other Topics Concern   Not on file  Social History Narrative   Pt follows low sodium diet, no caffeine, avoids sugar.       2 daughters, both married   Social Determinants of Health   Financial Resource Strain: Low Risk  (01/13/2022)   Received from [facility], [facility]   Overall Financial Resource Strain (CARDIA)    Difficulty of Paying Living Expenses: Not hard at all  Food Insecurity: No Food Insecurity (01/13/2022)   Received from [facility], [facility]   Hunger Vital Sign    Worried About Running Out of Food in the Last Year: Never true    Ran Out of Food in the Last Year: Never true  Transportation Needs: No Transportation Needs (01/13/2022)   Received from [facility], [facility]   [facility] - Transportation    In the past 12 months, has lack of transportation kept you from medical appointments or from getting medications?: No    Lack of Transportation (Non-Medical): No  Physical Activity: Sufficiently Active (12/28/2021)   Exercise Vital Sign    Days of Exercise per Week: 5 days    Minutes of Exercise per Session: 30 min  Stress: No Stress Concern Present (12/28/2021)   [employer] of  Occupational Health - Occupational Stress Questionnaire    Feeling of Stress : Not at all  Social Connections: Socially Integrated (12/28/2021)   Social Connection and Isolation Panel [NHANES]    Frequency of Communication with Friends and Family: More than three times a week    Frequency of Social Gatherings with Friends and Family: Once a week    Attends Religious Services: 1 to 4 times per year    Active Member of [employer] or Organizations: Yes    Attends Banker Meetings: 1 to 4 times per year    Marital Status: Married    Tobacco Counseling Counseling given: Not Answered   Clinical Intake:  Activities of Daily Living     No data to display          Patient Care Team: Reubin Milan, MD as PCP - General (Internal Medicine) Valarie Cones, Ihor Austin, MD as Referring Physician (Endocrinology) Nolon Rod, MD as Referring Physician (Cardiology) Dr Dorena Dew, NP as Nurse Practitioner (Cardiology) Deirdre Evener, MD (Dermatology)  Indicate any recent Medical Services you may have received from other than [facility] providers in the past year (date may be approximate).     Assessment:   This is a routine wellness examination for Carmen Graves.  Hearing/Vision screen No results found.  Dietary issues and exercise activities discussed:     Goals Addressed   None    Depression Screen    02/11/2022    8:56 AM 12/28/2021    1:29 PM 12/28/2021    1:23 PM 02/11/2021    8:24 AM 12/24/2020   12:43 PM 02/06/2020    8:47 AM 11/28/2019    1:47 PM  PHQ 2/9 Scores  PHQ - 2 Score 0 0 0 0 0 0 0  PHQ- 9 Score 0   0  0     Fall Risk    02/11/2022    8:57 AM 12/28/2021    1:29 PM 02/11/2021    8:25 AM 12/24/2020   12:48 PM 11/28/2019    1:56 PM  Fall Risk   Falls in the past year? 0 0 0 0 0  Number falls in past yr: 0 0 0 0 0  Injury with Fall? 0 0 0 0 0  Risk for fall due to : No Fall Risks No Fall Risks No Fall Risks No  Fall Risks No Fall Risks  Follow up Falls evaluation completed Falls evaluation completed Falls evaluation completed Falls prevention discussed Falls prevention discussed    MEDICARE RISK AT HOME:   TIMED UP AND GO:  Was the test performed?  No    Cognitive Function:        12/28/2021    1:31 PM  6CIT Screen  What Year? 0 points  What month? 0 points  What time? 0 points  Count back from 20 0 points  Months in reverse 0 points  Repeat phrase 0 points  Total Score 0 points    Immunizations Immunization History  Administered Date(s) Administered   COVID-19, mRNA, vaccine(Comirnaty)12 years and older 07/09/2022   Fluad Quad(high Dose 65+) 02/01/2019, 02/06/2020, 02/11/2021, 02/11/2022   Influenza,inj,Quad PF,6+ Mos 05/02/2015, 03/15/2016, 01/20/2017, 01/20/2018   Influenza-Unspecified 05/05/2015   PFIZER(Purple Top)SARS-COV-2 Vaccination 07/11/2019, 08/01/2019, 03/31/2020   Pneumococcal Conjugate-13 01/15/2016   Pneumococcal Polysaccharide-23 06/13/2014, 06/12/2019   Pneumococcal-Unspecified 06/13/2014   Tdap 11/22/2014    TDAP status: Up to date  Flu Vaccine status: Up to date  Pneumococcal vaccine status: Up to date  Covid-19 vaccine status: Completed vaccines  Qualifies for Shingles Vaccine? Yes   Zostavax completed No  #1 only Shingrix Completed?: No.    Education has been provided regarding the importance of this vaccine. Patient has been advised to call insurance company to determine out of pocket expense if they have not yet received this vaccine. Advised may also receive vaccine at local pharmacy or Health Dept. Verbalized acceptance and understanding.  Screening Tests Health Maintenance  Topic Date Due   Zoster Vaccines- Shingrix (1 of 2) Never done   COVID-19 Vaccine (5 - 2023-24 season) 11/07/2022   Medicare Annual Wellness (AWV)  12/29/2022   INFLUENZA VACCINE  12/30/2022  MAMMOGRAM  03/10/2023   DTaP/Tdap/Td (2 - Td or Tdap) 11/21/2024   DEXA  SCAN  01/14/2032   Colonoscopy  01/30/2032   Pneumonia Vaccine 84+ Years old  Completed   Hepatitis C Screening  Completed   HPV VACCINES  Aged Out    Health Maintenance  Health Maintenance Due  Topic Date Due   Zoster Vaccines- Shingrix (1 of 2) Never done   COVID-19 Vaccine (5 - 2023-24 season) 11/07/2022   Medicare Annual Wellness (AWV)  12/29/2022   INFLUENZA VACCINE  12/30/2022    Colorectal cancer screening: Type of screening: Colonoscopy. Completed yes. Repeat every 10 years  Mammogram status: Completed yes. Repeat every year  Bone Density status: Completed yes. Results reflect: Bone density results: OSTEOPOROSIS. Repeat every 3-5 years.  Lung Cancer Screening: (Low Dose CT Chest recommended if Age 78-80 years, 20 pack-year currently smoking OR have quit w/in 15years.) does not qualify.   Lung Cancer Screening Referral: no  Additional Screening:  Hepatitis C Screening: does not qualify; Completed yes  Vision Screening: Recommended annual ophthalmology exams for early detection of glaucoma and other disorders of the eye. Is the patient up to date with their annual eye exam?  Yes  Who is the provider or what is the name of the office in which the patient attends annual eye exams? [facility] If pt is not established with a provider, would they like to be referred to a provider to establish care? No .   Dental Screening: Recommended annual dental exams for proper oral hygiene  Diabetic Foot Exam: n/a  Community Resource Referral / Chronic Care Management: CRR required this visit?  No   CCM required this visit?  No    Plan:     I have personally reviewed and noted the following in the patient's chart:   Medical and social history Use of alcohol, tobacco or illicit drugs  Current medications and supplements including opioid prescriptions. Patient is not currently taking opioid prescriptions. Functional ability and status Nutritional status Physical  activity Advanced directives List of other physicians Hospitalizations, surgeries, and ER visits in previous 12 months Vitals Screenings to include cognitive, depression, and falls Referrals and appointments  In addition, I have reviewed and discussed with patient certain preventive protocols, quality metrics, and best practice recommendations. A written personalized care plan for preventive services as well as general preventive health recommendations were provided to patient.    Sue Lush, LPN   [id number]/96/2952   After Visit Summary: (MyChart) Due to this being a telephonic visit, the after visit summary with patients personalized plan was offered to patient via MyChart   Nurse Notes: The patient states she is doing well and has no concerns or questions at this time.

## 2023-01-12 NOTE — Patient Instructions (Signed)
Carmen Graves , Thank you for taking time to come for your Medicare Wellness Visit. I appreciate your ongoing commitment to your health goals. Please review the following plan we discussed and let me know if I can assist you in the future.   Referrals/Orders/Follow-Ups/Clinician Recommendations: none  This is a list of the screening recommended for you and due dates:  Health Maintenance  Topic Date Due   Zoster (Shingles) Vaccine (1 of 2) Never done   COVID-19 Vaccine (5 - 2023-24 season) 11/07/2022   Flu Shot  12/30/2022   Mammogram  03/10/2023   Medicare Annual Wellness Visit  01/12/2024   DTaP/Tdap/Td vaccine (2 - Td or Tdap) 11/21/2024   DEXA scan (bone density measurement)  01/14/2032   Colon Cancer Screening  01/30/2032   Pneumonia Vaccine  Completed   Hepatitis C Screening  Completed   HPV Vaccine  Aged Out    Advanced directives: (Provided) Advance directive discussed with you today. I have provided a copy for you to complete at home and have notarized. Once this is complete, please bring a copy in to our office so we can scan it into your chart. mailed  Next Medicare Annual Wellness Visit scheduled for next year: Yes 01/17/2024 @ 2:45pm telephone  Preventive Care 65 Years and Older, Female Preventive care refers to lifestyle choices and visits with your health care provider that can promote health and wellness. What does preventive care include? A yearly physical exam. This is also called an annual well check. Dental exams once or twice a year. Routine eye exams. Ask your health care provider how often you should have your eyes checked. Personal lifestyle choices, including: Daily care of your teeth and gums. Regular physical activity. Eating a healthy diet. Avoiding tobacco and drug use. Limiting alcohol use. Practicing safe sex. Taking low-dose aspirin every day. Taking vitamin and mineral supplements as recommended by your health care provider. What happens during an  annual well check? The services and screenings done by your health care provider during your annual well check will depend on your age, overall health, lifestyle risk factors, and family history of disease. Counseling  Your health care provider may ask you questions about your: Alcohol use. Tobacco use. Drug use. Emotional well-being. Home and relationship well-being. Sexual activity. Eating habits. History of falls. Memory and ability to understand (cognition). Work and work Astronomer. Reproductive health. Screening  You may have the following tests or measurements: Height, weight, and BMI. Blood pressure. Lipid and cholesterol levels. These may be checked every 5 years, or more frequently if you are over 105 years old. Skin check. Lung cancer screening. You may have this screening every year starting at age 34 if you have a 30-pack-year history of smoking and currently smoke or have quit within the past 15 years. Fecal occult blood test (FOBT) of the stool. You may have this test every year starting at age 48. Flexible sigmoidoscopy or colonoscopy. You may have a sigmoidoscopy every 5 years or a colonoscopy every 10 years starting at age 74. Hepatitis C blood test. Hepatitis B blood test. Sexually transmitted disease (STD) testing. Diabetes screening. This is done by checking your blood sugar (glucose) after you have not eaten for a while (fasting). You may have this done every 1-3 years. Bone density scan. This is done to screen for osteoporosis. You may have this done starting at age 46. Mammogram. This may be done every 1-2 years. Talk to your health care provider about how often you  should have regular mammograms. Talk with your health care provider about your test results, treatment options, and if necessary, the need for more tests. Vaccines  Your health care provider may recommend certain vaccines, such as: Influenza vaccine. This is recommended every year. Tetanus,  diphtheria, and acellular pertussis (Tdap, Td) vaccine. You may need a Td booster every 10 years. Zoster vaccine. You may need this after age 45. Pneumococcal 13-valent conjugate (PCV13) vaccine. One dose is recommended after age 23. Pneumococcal polysaccharide (PPSV23) vaccine. One dose is recommended after age 39. Talk to your health care provider about which screenings and vaccines you need and how often you need them. This information is not intended to replace advice given to you by your health care provider. Make sure you discuss any questions you have with your health care provider. Document Released: 06/13/2015 Document Revised: 02/04/2016 Document Reviewed: 03/18/2015 Elsevier Interactive Patient Education  2017 [employer].  Fall Prevention in the Home Falls can cause injuries. They can happen to people of all ages. There are many things you can do to make your home safe and to help prevent falls. What can I do on the outside of my home? Regularly fix the edges of walkways and driveways and fix any cracks. Remove anything that might make you trip as you walk through a door, such as a raised step or threshold. Trim any bushes or trees on the path to your home. Use bright outdoor lighting. Clear any walking paths of anything that might make someone trip, such as rocks or tools. Regularly check to see if handrails are loose or broken. Make sure that both sides of any steps have handrails. Any raised decks and porches should have guardrails on the edges. Have any leaves, snow, or ice cleared regularly. Use sand or salt on walking paths during winter. Clean up any spills in your garage right away. This includes oil or grease spills. What can I do in the bathroom? Use night lights. Install grab bars by the toilet and in the tub and shower. Do not use towel bars as grab bars. Use non-skid mats or decals in the tub or shower. If you need to sit down in the shower, use a plastic,  non-slip stool. Keep the floor dry. Clean up any water that spills on the floor as soon as it happens. Remove soap buildup in the tub or shower regularly. Attach bath mats securely with double-sided non-slip rug tape. Do not have throw rugs and other things on the floor that can make you trip. What can I do in the bedroom? Use night lights. Make sure that you have a light by your bed that is easy to reach. Do not use any sheets or blankets that are too big for your bed. They should not hang down onto the floor. Have a firm chair that has side arms. You can use this for support while you get dressed. Do not have throw rugs and other things on the floor that can make you trip. What can I do in the kitchen? Clean up any spills right away. Avoid walking on wet floors. Keep items that you use a lot in easy-to-reach places. If you need to reach something above you, use a strong step stool that has a grab bar. Keep electrical cords out of the way. Do not use floor polish or wax that makes floors slippery. If you must use wax, use non-skid floor wax. Do not have throw rugs and other things on the  floor that can make you trip. What can I do with my stairs? Do not leave any items on the stairs. Make sure that there are handrails on both sides of the stairs and use them. Fix handrails that are broken or loose. Make sure that handrails are as long as the stairways. Check any carpeting to make sure that it is firmly attached to the stairs. Fix any carpet that is loose or worn. Avoid having throw rugs at the top or bottom of the stairs. If you do have throw rugs, attach them to the floor with carpet tape. Make sure that you have a light switch at the top of the stairs and the bottom of the stairs. If you do not have them, ask someone to add them for you. What else can I do to help prevent falls? Wear shoes that: Do not have high heels. Have rubber bottoms. Are comfortable and fit you well. Are closed  at the toe. Do not wear sandals. If you use a stepladder: Make sure that it is fully opened. Do not climb a closed stepladder. Make sure that both sides of the stepladder are locked into place. Ask someone to hold it for you, if possible. Clearly mark and make sure that you can see: Any grab bars or handrails. First and last steps. Where the edge of each step is. Use tools that help you move around (mobility aids) if they are needed. These include: Canes. Walkers. Scooters. Crutches. Turn on the lights when you go into a dark area. Replace any light bulbs as soon as they burn out. Set up your furniture so you have a clear path. Avoid moving your furniture around. If any of your floors are uneven, fix them. If there are any pets around you, be aware of where they are. Review your medicines with your doctor. Some medicines can make you feel dizzy. This can increase your chance of falling. Ask your doctor what other things that you can do to help prevent falls. This information is not intended to replace advice given to you by your health care provider. Make sure you discuss any questions you have with your health care provider. Document Released: 03/13/2009 Document Revised: 10/23/2015 Document Reviewed: 06/21/2014 Elsevier Interactive Patient Education  2017 [employer].

## 2023-02-07 ENCOUNTER — Other Ambulatory Visit: Payer: Self-pay | Admitting: Internal Medicine

## 2023-02-07 DIAGNOSIS — Z1231 Encounter for screening mammogram for malignant neoplasm of breast: Secondary | ICD-10-CM

## 2023-02-14 ENCOUNTER — Encounter: Payer: Self-pay | Admitting: Internal Medicine

## 2023-02-14 ENCOUNTER — Other Ambulatory Visit: Payer: Self-pay | Admitting: Internal Medicine

## 2023-02-14 NOTE — Progress Notes (Unsigned)
Date:  02/14/2023   Name:  Carmen Graves   DOB:  [birth year]   MRN:  [id number]   Chief Complaint: No chief complaint on file. SHANTELLE SIBLE is a 67 y.o. female who presents today for her Complete Annual Exam. She feels {DESC; WELL/FAIRLY WELL/POORLY:18703}. She reports exercising ***. She reports she is sleeping {DESC; WELL/FAIRLY WELL/POORLY:18703}. Breast complaints ***.  Mammogram: scheduled 03/17/23 DEXA: 01/13/22 osteoporosis Colonoscopy: 01/2022 repeat 10 yr  Health Maintenance Due  Topic Date Due   Zoster Vaccines- Shingrix (1 of 2) Never done   INFLUENZA VACCINE  12/30/2022   COVID-19 Vaccine (5 - 2023-24 season) 01/30/2023    Immunization History  Administered Date(s) Administered   COVID-19, mRNA, vaccine(Comirnaty)12 years and older 07/09/2022   Fluad Quad(high Dose 65+) 02/01/2019, 02/06/2020, 02/11/2021, 02/11/2022   Influenza,inj,Quad PF,6+ Mos 05/02/2015, 03/15/2016, 01/20/2017, 01/20/2018   Influenza-Unspecified 05/05/2015   PFIZER(Purple Top)SARS-COV-2 Vaccination 07/11/2019, 08/01/2019, 03/31/2020   Pneumococcal Conjugate-13 01/15/2016   Pneumococcal Polysaccharide-23 06/13/2014, 06/12/2019   Pneumococcal-Unspecified 06/13/2014   Tdap 11/22/2014    HPI  Lab Results  Component Value Date   NA 139 01/04/2023   K 4.4 01/04/2023   CO2 23 (A) 01/04/2023   GLUCOSE 88 02/11/2022   BUN 21 01/04/2023   CREATININE 0.8 01/04/2023   CALCIUM 9.0 01/04/2023   EGFR 80 01/04/2023   GFRNONAA 93 02/06/2020   Lab Results  Component Value Date   CHOL 209 (H) 02/11/2022   HDL 43 02/11/2022   LDLCALC 140 (H) 02/11/2022   TRIG 147 02/11/2022   CHOLHDL 4.9 (H) 02/11/2022   Lab Results  Component Value Date   TSH 0.81 01/04/2023   No results found for: "HGBA1C" Lab Results  Component Value Date   WBC 7.3 01/04/2023   HGB 13.7 01/04/2023   HCT 43 01/04/2023   MCV 93 02/11/2022   PLT 232 01/04/2023   Lab Results  Component Value Date   ALT 29 01/04/2023    AST 25 01/04/2023   ALKPHOS 64 01/04/2023   BILITOT 0.6 02/11/2022   Lab Results  Component Value Date   VD25OH 42.6 02/11/2022     Review of Systems  Patient Active Problem List   Diagnosis Date Noted   Slow transit constipation 02/11/2022   Vitamin D deficiency 09/21/2021   Iron deficiency anemia 09/21/2021   Displacement of cervical intervertebral disc 09/21/2021   Acquired thrombophilia (HCC) 02/11/2021   Need for prophylactic antibiotic 04/08/2016   Abnormal genetic test 05/08/2015   Presence of automatic cardioverter/defibrillator (AICD) 03/13/2015   Hyperlipidemia, mild 03/05/2015   Osteoporosis 03/05/2015   Hypertrophic obstructive cardiomyopathy (HCC) 02/27/2015    Allergies  Allergen Reactions   Albuterol Shortness Of Breath    Chest pain and SOB   Budesonide-Formoterol Fumarate Shortness Of Breath    Chest pain, severe SOB    Fluticasone-Salmeterol Shortness Of Breath   Ipratropium-Albuterol Shortness Of Breath    Chest pain   Celecoxib     Other reaction(s): Kidney Disorder   Codeine Nausea Only   Nsaids Other (See Comments)    Causes kidney disease    Penicillins    Prednisone Other (See Comments)    Chest pain and tightness    Past Surgical History:  Procedure Laterality Date   BONY PELVIS SURGERY     tore muscle from pelvic bone   BREAST BIOPSY Bilateral 15+ YRS AGO   NEG   BREAST CYST ASPIRATION Bilateral    CARDIAC DEFIBRILLATOR PLACEMENT  01/2015  for HOCM   MYOMECTOMY  06/2015   TONSILLECTOMY AND ADENOIDECTOMY     UMBILICAL HERNIA REPAIR      Social History   Tobacco Use   Smoking status: Never   Smokeless tobacco: Never  Vaping Use   Vaping status: Never Used  Substance Use Topics   Alcohol use: No    Alcohol/week: 0.0 standard drinks of alcohol   Drug use: No     Medication list has been reviewed and updated.  No outpatient medications have been marked as taking for the 02/14/23 encounter (Abstract) with Reubin Milan, MD.       02/11/2022    8:57 AM 02/11/2021    8:24 AM 02/06/2020    8:48 AM  GAD 7 : Generalized Anxiety Score  Nervous, Anxious, on Edge 2 3 0  Control/stop worrying 0 3 1  Worry too much - different things 0 3 1  Trouble relaxing 0 3 0  Restless 0 3 0  Easily annoyed or irritable 0 3 0  Afraid - awful might happen 0 3 0  Total GAD 7 Score 2 21 2   Anxiety Difficulty Not difficult at all  Not difficult at all       01/12/2023    2:47 PM 02/11/2022    8:56 AM 12/28/2021    1:29 PM  Depression screen PHQ 2/9  Decreased Interest 0 0 0  Down, Depressed, Hopeless 0 0 0  PHQ - 2 Score 0 0 0  Altered sleeping  0   Tired, decreased energy  0   Change in appetite  0   Feeling bad or failure about yourself   0   Trouble concentrating  0   Moving slowly or fidgety/restless  0   Suicidal thoughts  0   PHQ-9 Score  0   Difficult doing work/chores  Not difficult at all     BP Readings from Last 3 Encounters:  02/11/22 118/70  02/11/21 122/72  02/06/20 112/74    Physical Exam  Wt Readings from Last 3 Encounters:  01/12/23 135 lb (61.2 kg)  02/11/22 135 lb 3.2 oz (61.3 kg)  02/11/21 137 lb (62.1 kg)    There were no vitals taken for this visit.  Assessment and Plan:  Problem List Items Addressed This Visit   None   No follow-ups on file.    Reubin Milan, MD [facility] [place]

## 2023-02-15 ENCOUNTER — Encounter: Payer: Self-pay | Admitting: Internal Medicine

## 2023-02-15 ENCOUNTER — Ambulatory Visit (INDEPENDENT_AMBULATORY_CARE_PROVIDER_SITE_OTHER): Payer: Medicare Other | Admitting: Internal Medicine

## 2023-02-15 VITALS — BP 118/80 | HR 76 | Ht 64.0 in | Wt 135.0 lb

## 2023-02-15 DIAGNOSIS — I480 Paroxysmal atrial fibrillation: Secondary | ICD-10-CM

## 2023-02-15 DIAGNOSIS — K5901 Slow transit constipation: Secondary | ICD-10-CM

## 2023-02-15 DIAGNOSIS — D6869 Other thrombophilia: Secondary | ICD-10-CM

## 2023-02-15 DIAGNOSIS — E785 Hyperlipidemia, unspecified: Secondary | ICD-10-CM

## 2023-02-15 DIAGNOSIS — E559 Vitamin D deficiency, unspecified: Secondary | ICD-10-CM

## 2023-02-15 DIAGNOSIS — F411 Generalized anxiety disorder: Secondary | ICD-10-CM

## 2023-02-15 DIAGNOSIS — Z23 Encounter for immunization: Secondary | ICD-10-CM | POA: Diagnosis not present

## 2023-02-15 DIAGNOSIS — M810 Age-related osteoporosis without current pathological fracture: Secondary | ICD-10-CM

## 2023-02-15 DIAGNOSIS — I421 Obstructive hypertrophic cardiomyopathy: Secondary | ICD-10-CM

## 2023-02-15 MED ORDER — SERTRALINE HCL 25 MG PO TABS
25.0000 mg | ORAL_TABLET | Freq: Every day | ORAL | 3 refills | Status: DC
Start: 2023-02-15 — End: 2024-02-17

## 2023-02-15 NOTE — Assessment & Plan Note (Signed)
Managed with diet. Lab Results  Component Value Date   LDLCALC 140 (H) 02/11/2022

## 2023-02-15 NOTE — Assessment & Plan Note (Signed)
Clinically stable on current regimen with good control of symptoms, No SI or HI. No change in management at this time. Sertraline 25 mg daily.

## 2023-02-15 NOTE — Assessment & Plan Note (Signed)
Followed by Carmen Graves On Actonel Last DEXA 2023

## 2023-02-15 NOTE — Assessment & Plan Note (Signed)
On Eliquis without bleeding issues

## 2023-02-15 NOTE — Assessment & Plan Note (Addendum)
Chronic intermittent symptoms managed with fiber and Senna-S

## 2023-02-15 NOTE — Assessment & Plan Note (Addendum)
Doing well.  Recently in ED for chest pain. On metoprolol for rate control and PRN diltiazem.

## 2023-02-15 NOTE — Assessment & Plan Note (Signed)
On daily supplement

## 2023-02-15 NOTE — Assessment & Plan Note (Addendum)
On metoprolol daily, Eliquis. Having more palpitation so taking Cardiazem more PRN

## 2023-02-15 NOTE — Progress Notes (Signed)
Date:  02/15/2023   Name:  Carmen Graves   DOB:  1956-03-03   MRN:  [id number]   Chief Complaint: Establish Care Carmen Graves is a 67 y.o. female who presents today for her Complete Annual Exam. She feels well. She reports exercising 20- 30 mins yoga and dance every other day as well as walking and yard work. She reports she is sleeping fairly well. Breast complaints none.  Mammogram: 03/2023 scheduled DEXA: 12/2021 Colonoscopy: 01/2022 repeat 10 yrs  Health Maintenance Due  Topic Date Due   COVID-19 Vaccine (5 - 2023-24 season) 01/30/2023   Zoster Vaccines- Shingrix (2 of 2) 02/07/2023    Immunization History  Administered Date(s) Administered   COVID-19, mRNA, vaccine(Comirnaty)12 years and older 07/09/2022   Fluad Quad(high Dose 65+) 02/01/2019, 02/06/2020, 02/11/2021, 02/11/2022   Fluad Trivalent(High Dose 65+) 02/15/2023   Influenza,inj,Quad PF,6+ Mos 05/02/2015, 03/15/2016, 01/20/2017, 01/20/2018   Influenza-Unspecified 05/05/2015   PFIZER(Purple Top)SARS-COV-2 Vaccination 07/11/2019, 08/01/2019, 03/31/2020   Pneumococcal Conjugate-13 01/15/2016   Pneumococcal Polysaccharide-23 06/13/2014, 06/12/2019   Pneumococcal-Unspecified 06/13/2014   Tdap 11/22/2014   Zoster Recombinant(Shingrix) 12/13/2022    Constipation This is a recurrent problem. The stool is described as firm. The patient is on a high fiber diet. She Does not exercise regularly. There has Been adequate water intake. Pertinent negatives include no abdominal pain, diarrhea, fever or vomiting. She has tried laxatives, fiber and diet changes for the symptoms. The treatment provided significant relief.  Hyperlipidemia This is a chronic problem. Recent lipid tests were reviewed and are high. Pertinent negatives include no chest pain or shortness of breath. Current antihyperlipidemic treatment includes diet change.  Anxiety Presents for follow-up visit. Patient reports no chest pain, dizziness, nervous/anxious  behavior, palpitations or shortness of breath. Symptoms occur most days.   Compliance with medications is 76-100% (sertraline working well).    Lab Results  Component Value Date   NA 139 01/04/2023   K 4.4 01/04/2023   CO2 23 (A) 01/04/2023   GLUCOSE 88 02/11/2022   BUN 21 01/04/2023   CREATININE 0.8 01/04/2023   CALCIUM 9.0 01/04/2023   EGFR 80 01/04/2023   GFRNONAA 93 02/06/2020   Lab Results  Component Value Date   CHOL 209 (H) 02/11/2022   HDL 43 02/11/2022   LDLCALC 140 (H) 02/11/2022   TRIG 147 02/11/2022   CHOLHDL 4.9 (H) 02/11/2022   Lab Results  Component Value Date   TSH 0.81 01/04/2023   No results found for: "HGBA1C" Lab Results  Component Value Date   WBC 7.3 01/04/2023   HGB 13.7 01/04/2023   HCT 43 01/04/2023   MCV 93 02/11/2022   PLT 232 01/04/2023   Lab Results  Component Value Date   ALT 29 01/04/2023   AST 25 01/04/2023   ALKPHOS 64 01/04/2023   BILITOT 0.6 02/11/2022   Lab Results  Component Value Date   VD25OH 42.6 02/11/2022     Review of Systems  Constitutional:  Negative for chills, fatigue and fever.  HENT:  Negative for congestion, hearing loss, tinnitus, trouble swallowing and voice change.   Eyes:  Positive for visual disturbance.       Intermittent double vision - now has glasses with prisms  Respiratory:  Negative for cough, chest tightness, shortness of breath and wheezing.   Cardiovascular:  Negative for chest pain, palpitations and leg swelling.  Gastrointestinal:  Negative for abdominal pain, constipation, diarrhea and vomiting.  Endocrine: Negative for polydipsia and polyuria.  Genitourinary:  Negative for dysuria, frequency, genital sores, vaginal bleeding and vaginal discharge.  Musculoskeletal:  Negative for arthralgias, gait problem and joint swelling.  Skin:  Negative for color change and rash.  Neurological:  Negative for dizziness, tremors, light-headedness and headaches.  Hematological:  Negative for adenopathy.  Does not bruise/bleed easily.  Psychiatric/Behavioral:  Negative for dysphoric mood and sleep disturbance. The patient is not nervous/anxious.     Patient Active Problem List   Diagnosis Date Noted   Slow transit constipation 02/11/2022   Vitamin D deficiency 09/21/2021   Iron deficiency anemia 09/21/2021   Displacement of cervical intervertebral disc 09/21/2021   Acquired thrombophilia (HCC) 02/11/2021   Need for prophylactic antibiotic 04/08/2016   Paroxysmal atrial fibrillation (HCC) 06/20/2015   Abnormal genetic test 05/08/2015   Presence of automatic cardioverter/defibrillator (AICD) 03/13/2015   Hyperlipidemia, mild 03/05/2015   Osteoporosis 03/05/2015   Hypertrophic obstructive cardiomyopathy (HCC) 02/27/2015    Allergies  Allergen Reactions   Albuterol Shortness Of Breath    Chest pain and SOB   Budesonide-Formoterol Fumarate Shortness Of Breath    Chest pain, severe SOB    Fluticasone-Salmeterol Shortness Of Breath   Ipratropium-Albuterol Shortness Of Breath    Chest pain   Celecoxib     Other reaction(s): Kidney Disorder   Codeine Nausea Only   Nsaids Other (See Comments)    Causes kidney disease    Penicillins    Prednisone Other (See Comments)    Chest pain and tightness    Past Surgical History:  Procedure Laterality Date   BLEPHAROPLASTY Bilateral 07/2022   BONY PELVIS SURGERY     tore muscle from pelvic bone   BREAST BIOPSY Bilateral 15+ YRS AGO   NEG   BREAST CYST ASPIRATION Bilateral    CARDIAC DEFIBRILLATOR PLACEMENT  01/2015   for HOCM   MYOMECTOMY  06/2015   TONSILLECTOMY AND ADENOIDECTOMY     UMBILICAL HERNIA REPAIR      Social History   Tobacco Use   Smoking status: Never   Smokeless tobacco: Never  Vaping Use   Vaping status: Never Used  Substance Use Topics   Alcohol use: No    Alcohol/week: 0.0 standard drinks of alcohol   Drug use: No     Medication list has been reviewed and updated.  Current Meds  Medication Sig    acetaminophen (TYLENOL) 650 MG CR tablet Take by mouth.   apixaban (ELIQUIS) 5 MG TABS tablet Take 5 mg by mouth 2 (two) times daily.    Ascorbic Acid (VITAMIN C) 100 MG CHEW    Boswellia Serrata (BOSWELLIA PO) Take by mouth. Pt takes 3 in am and 3 in pm   calcium citrate-vitamin D (CITRACAL+D) 315-200 MG-UNIT tablet Take 1 tablet by mouth daily.   Cholecalciferol (VITAMIN D3) 50 MCG (2000 UT) capsule Take 1 capsule by mouth daily. 2000 units   diltiazem (CARDIZEM) 30 MG tablet Take 30 mg by mouth as needed.   Glucosamine 500 MG CAPS Take by mouth. Pt takes 1000 mg BID   MAGNESIUM PO Take by mouth.   melatonin 1 MG TABS tablet Take 1 mg by mouth at bedtime.   metoprolol succinate (TOPROL-XL) 25 MG 24 hr tablet Take 25 mg by mouth daily. Pt taking 1/2 tablet 2 times a day   risedronate (ACTONEL) 150 MG tablet Take 150 mg by mouth every 30 (thirty) days. with water on empty stomach, nothing by mouth or lie down for next 30 minutes. For bone density   S-Adenosylmethionine (  SAM-E PO) Take by mouth. For arthritis   S-Adenosylmethionine 200 MG TBEC Take by mouth.   vitamin C (ASCORBIC ACID) 500 MG tablet Take 500 mg by mouth daily.   [DISCONTINUED] sertraline (ZOLOFT) 25 MG tablet Take 1 tablet (25 mg total) by mouth daily.       02/15/2023    8:44 AM 02/11/2022    8:57 AM 02/11/2021    8:24 AM 02/06/2020    8:48 AM  GAD 7 : Generalized Anxiety Score  Nervous, Anxious, on Edge 0 2 3 0  Control/stop worrying 0 0 3 1  Worry too much - different things 0 0 3 1  Trouble relaxing 0 0 3 0  Restless 0 0 3 0  Easily annoyed or irritable 0 0 3 0  Afraid - awful might happen 0 0 3 0  Total GAD 7 Score 0 2 21 2   Anxiety Difficulty Not difficult at all Not difficult at all  Not difficult at all       02/15/2023    8:44 AM 01/12/2023    2:47 PM 02/11/2022    8:56 AM  Depression screen PHQ 2/9  Decreased Interest 0 0 0  Down, Depressed, Hopeless 0 0 0  PHQ - 2 Score 0 0 0  Altered sleeping 2  0   Tired, decreased energy 0  0  Change in appetite 0  0  Feeling bad or failure about yourself  0  0  Trouble concentrating 0  0  Moving slowly or fidgety/restless 0  0  Suicidal thoughts 0  0  PHQ-9 Score 2  0  Difficult doing work/chores Not difficult at all  Not difficult at all    BP Readings from Last 3 Encounters:  02/15/23 118/80  02/11/22 118/70  02/11/21 122/72    Physical Exam Vitals and nursing note reviewed.  Constitutional:      General: She is not in acute distress.    Appearance: She is well-developed.  HENT:     Head: Normocephalic and atraumatic.     Right Ear: Tympanic membrane and ear canal normal.     Left Ear: Tympanic membrane and ear canal normal.     Nose:     Right Sinus: No maxillary sinus tenderness.     Left Sinus: No maxillary sinus tenderness.  Eyes:     General: No scleral icterus.       Right eye: No discharge.        Left eye: No discharge.     Conjunctiva/sclera: Conjunctivae normal.  Neck:     Thyroid: No thyromegaly.     Vascular: No carotid bruit.  Cardiovascular:     Rate and Rhythm: Normal rate and regular rhythm.     Pulses: Normal pulses.     Heart sounds: Normal heart sounds.  Pulmonary:     Effort: Pulmonary effort is normal. No respiratory distress.     Breath sounds: No wheezing.  Chest:  Breasts:    Right: No mass, nipple discharge, skin change or tenderness.     Left: No mass, nipple discharge, skin change or tenderness.  Abdominal:     General: Bowel sounds are normal.     Palpations: Abdomen is soft.     Tenderness: There is no abdominal tenderness.  Musculoskeletal:     Cervical back: Normal range of motion. No erythema.     Right lower leg: No edema.     Left lower leg: No edema.  Lymphadenopathy:  Cervical: No cervical adenopathy.  Skin:    General: Skin is warm and dry.     Capillary Refill: Capillary refill takes less than 2 seconds.     Findings: No rash.  Neurological:     Mental Status: She is  alert and oriented to person, place, and time.     Cranial Nerves: No cranial nerve deficit.     Sensory: No sensory deficit.     Deep Tendon Reflexes: Reflexes are normal and symmetric.  Psychiatric:        Attention and Perception: Attention normal.        Mood and Affect: Mood normal.        Behavior: Behavior normal.        Thought Content: Thought content normal.     Wt Readings from Last 3 Encounters:  02/15/23 135 lb (61.2 kg)  01/12/23 135 lb (61.2 kg)  02/11/22 135 lb 3.2 oz (61.3 kg)    BP 118/80   Pulse 76   Ht 5\' 4"  (1.626 m)   Wt 135 lb (61.2 kg)   SpO2 98%   BMI 23.17 kg/m   Assessment and Plan:  Problem List Items Addressed This Visit       Unprioritized   Vitamin D deficiency    On daily supplement      Relevant Orders   VITAMIN D 25 Hydroxy (Vit-D Deficiency, Fractures)   Slow transit constipation - Primary    Chronic intermittent symptoms managed with fiber and Senna-S       Paroxysmal atrial fibrillation (HCC) (Chronic)    On metoprolol daily, Eliquis. Having more palpitation so taking Cardiazem more PRN      Relevant Orders   Basic metabolic panel   Osteoporosis (Chronic)    Followed by [facility] On Actonel Last DEXA 2023      Hypertrophic obstructive cardiomyopathy (HCC) (Chronic)    Doing well.  Recently in ED for chest pain. On metoprolol for rate control and PRN diltiazem.      Hyperlipidemia, mild (Chronic)    Managed with diet. Lab Results  Component Value Date   LDLCALC 140 (H) 02/11/2022         Relevant Orders   Lipid panel   Acquired thrombophilia (HCC) (Chronic)    On Eliquis without bleeding issues      Other Visit Diagnoses     Generalized anxiety disorder       Relevant Medications   sertraline (ZOLOFT) 25 MG tablet   Need for influenza vaccination       Relevant Orders   Flu Vaccine Trivalent High Dose (Fluad) (Completed)       No follow-ups on file.    Reubin Milan, MD [facility]  Primary Care and Sports Medicine [place]

## 2023-02-16 NOTE — Progress Notes (Signed)
PT saw labs on MyChart

## 2023-03-17 ENCOUNTER — Ambulatory Visit
Admission: RE | Admit: 2023-03-17 | Discharge: 2023-03-17 | Disposition: A | Discharge status: Home / Self Care | Payer: Medicare Other | Source: Ambulatory Visit | Attending: Internal Medicine | Admitting: Internal Medicine

## 2023-03-17 DIAGNOSIS — Z1231 Encounter for screening mammogram for malignant neoplasm of breast: Secondary | ICD-10-CM | POA: Diagnosis present

## 2024-01-19 ENCOUNTER — Ambulatory Visit (INDEPENDENT_AMBULATORY_CARE_PROVIDER_SITE_OTHER): Admitting: Emergency Medicine

## 2024-01-19 VITALS — Ht 64.0 in | Wt 132.0 lb

## 2024-01-19 DIAGNOSIS — Z1231 Encounter for screening mammogram for malignant neoplasm of breast: Secondary | ICD-10-CM

## 2024-01-19 DIAGNOSIS — Z0000 Encounter for general adult medical examination without abnormal findings: Secondary | ICD-10-CM

## 2024-01-19 NOTE — Progress Notes (Signed)
 Subjective:   Carmen Graves is a 68 y.o. who presents for a Medicare Wellness preventive visit.  As a reminder, Annual Wellness Visits don't include a physical exam, and some assessments may be limited, especially if this visit is performed virtually. We may recommend an in-person follow-up visit with your provider if needed.  Visit Complete: Virtual I connected with  Allisha Harter Diana on 01/19/24 by a audio enabled telemedicine application and verified that I am speaking with the correct person using two identifiers.  Patient Location: Home  Provider Location: Home Office  I discussed the limitations of evaluation and management by telemedicine. The patient expressed understanding and agreed to proceed.  Vital Signs: Because this visit was a virtual/telehealth visit, some criteria may be missing or patient reported. Any vitals not documented were not able to be obtained and vitals that have been documented are patient reported.  VideoDeclined- This patient declined Librarian, academic. Therefore the visit was completed with audio only.  Persons Participating in Visit: Patient.  AWV Questionnaire: No: Patient Medicare AWV questionnaire was not completed prior to this visit.  Cardiac Risk Factors include: advanced age (>47men, >78 women);dyslipidemia     Objective:    Today's Vitals   01/19/24 1509  Weight: 132 lb (59.9 kg)  Height: 5' 4 (1.626 m)  PainSc: 1    Body mass index is 22.66 kg/m.     01/19/2024    3:38 PM 01/12/2023    2:52 PM 12/28/2021    1:27 PM 12/24/2020   12:45 PM 11/28/2019    1:53 PM 01/15/2016    8:57 AM 07/18/2015    9:12 AM  Advanced Directives  Does Patient Have a Medical Advance Directive? No No No No No No  No   Does patient want to make changes to medical advance directive?   Yes (MAU/Ambulatory/Procedural Areas - Information given)  No - Patient declined    Would patient like information on creating a medical advance  directive? Yes (MAU/Ambulatory/Procedural Areas - Information given)  Yes (MAU/Ambulatory/Procedural Areas - Information given) No - Patient declined  Yes - Educational materials given  No - patient declined information      Data saved with a previous flowsheet row definition    Current Medications (verified) Outpatient Encounter Medications as of 01/19/2024  Medication Sig   acetaminophen (TYLENOL) 650 MG CR tablet Take by mouth.   apixaban (ELIQUIS) 5 MG TABS tablet Take 5 mg by mouth 2 (two) times daily.    Ascorbic Acid (VITAMIN C) 100 MG CHEW    azithromycin  (ZITHROMAX ) 250 MG tablet Take 250 mg by mouth once. Prior to dental treatment   Boswellia Serrata (BOSWELLIA PO) Take by mouth. Pt takes 3 in am and 3 in pm   calcium citrate-vitamin D  (CITRACAL+D) 315-200 MG-UNIT tablet Take 1 tablet by mouth daily.   diltiazem (CARDIZEM) 30 MG tablet Take 30 mg by mouth as needed.   Glucosamine 500 MG CAPS Take by mouth. Pt takes 1000 mg BID   Loteprednol Etabonate (LOTEMAX SM) 0.38 % GEL Apply 1 drop to eye daily. X 20 days, take 1 week break and repeat   MAGNESIUM PO Take by mouth.   melatonin 1 MG TABS tablet Take 1 mg by mouth at bedtime. (Patient taking differently: Take 1 mg by mouth at bedtime as needed.)   metoprolol succinate (TOPROL-XL) 25 MG 24 hr tablet Take 25 mg by mouth daily. Pt taking 1/2 tablet 2 times a day  risedronate (ACTONEL) 150 MG tablet Take 150 mg by mouth every 30 (thirty) days. with water on empty stomach, nothing by mouth or lie down for next 30 minutes. For bone density   S-Adenosylmethionine (SAM-E PO) Take by mouth. For arthritis (Patient taking differently: Take 400 mg by mouth daily. For arthritis)   sertraline  (ZOLOFT ) 25 MG tablet Take 1 tablet (25 mg total) by mouth daily.   vitamin C (ASCORBIC ACID) 500 MG tablet Take 500 mg by mouth daily.   Cholecalciferol (VITAMIN D3) 50 MCG (2000 UT) capsule Take 1 capsule by mouth daily. 2000 units (Patient not taking:  Reported on 01/19/2024)   S-Adenosylmethionine 200 MG TBEC Take by mouth. (Patient not taking: Reported on 01/19/2024)   No facility-administered encounter medications on file as of 01/19/2024.    Allergies (verified) Albuterol, Budesonide-formoterol fumarate, Fluticasone-salmeterol, Ipratropium-albuterol, Celecoxib, Codeine , Nsaids, Penicillins, Prednisone, and Caffeine   History: Past Medical History:  Diagnosis Date   A-fib (HCC)    Asthma    CHF (congestive heart failure), NYHA class III, chronic, diastolic (HCC) 03/26/2016   Hyperlipidemia    Hypertrophic obstructive cardiomyopathy (HCC)    Neurocardiogenic syncope 02/23/2015   Osteoarthrosis    Osteopenia    Palpitations    Presence of automatic cardioverter/defibrillator (AICD) 03/13/2015   Renal insufficiency    Status post cardiac surgery 03/31/2016   Ventriculomyomectomy, MV revision   Status post cardiac surgery 04/08/2016   Vitamin B deficiency    Past Surgical History:  Procedure Laterality Date   BLEPHAROPLASTY Bilateral 07/2022   BONY PELVIS SURGERY     tore muscle from pelvic bone   BREAST BIOPSY Bilateral 15+ YRS AGO   NEG   BREAST CYST ASPIRATION Bilateral    CARDIAC DEFIBRILLATOR PLACEMENT  01/2015   for HOCM   MYOMECTOMY  06/2015   TONSILLECTOMY AND ADENOIDECTOMY     UMBILICAL HERNIA REPAIR     Family History  Problem Relation Age of Onset   Stroke Mother    Heart disease Father    Stroke Father    Atrial fibrillation Other    Breast cancer Neg Hx    Social History   Socioeconomic History   Marital status: Married    Spouse name: Dorn   Number of children: 2   Years of education: Not on file   Highest education level: Not on file  Occupational History   Occupation: retired  Tobacco Use   Smoking status: Never   Smokeless tobacco: Never  Vaping Use   Vaping status: Never Used  Substance and Sexual Activity   Alcohol use: No    Alcohol/week: 0.0 standard drinks of alcohol   Drug  use: No   Sexual activity: Yes  Other Topics Concern   Not on file  Social History Narrative   Pt follows low sodium diet, no caffeine, avoids sugar.       2 daughters, both married   Social Drivers of Corporate investment banker Strain: Low Risk  (01/19/2024)   Overall Financial Resource Strain (CARDIA)    Difficulty of Paying Living Expenses: Not hard at all  Food Insecurity: No Food Insecurity (01/19/2024)   Hunger Vital Sign    Worried About Running Out of Food in the Last Year: Never true    Ran Out of Food in the Last Year: Never true  Transportation Needs: No Transportation Needs (01/19/2024)   PRAPARE - Administrator, Civil Service (Medical): No    Lack of Transportation (Non-Medical): No  Physical Activity: Sufficiently Active (01/19/2024)   Exercise Vital Sign    Days of Exercise per Week: 5 days    Minutes of Exercise per Session: 30 min  Stress: No Stress Concern Present (01/19/2024)   [employer] of Occupational Health - Occupational Stress Questionnaire    Feeling of Stress: Only a little  Social Connections: Socially Integrated (01/19/2024)   Social Connection and Isolation Panel    Frequency of Communication with Friends and Family: More than three times a week    Frequency of Social Gatherings with Friends and Family: More than three times a week    Attends Religious Services: More than 4 times per year    Active Member of [employer] or Organizations: Yes    Attends Engineer, structural: More than 4 times per year    Marital Status: Married    Tobacco Counseling Counseling given: Not Answered    Clinical Intake:  Pre-visit preparation completed: Yes  Pain : 0-10 Pain Score: 1  Pain Type: Chronic pain Pain Location: Other (Comment) (all over arthritic pain) Pain Descriptors / Indicators: Aching     BMI - recorded: 22.66 Nutritional Status: BMI of 19-24  Normal Nutritional Risks: None Diabetes: No  No results found for:  HGBA1C   How often do you need to have someone help you when you read instructions, pamphlets, or other written materials from your doctor or pharmacy?: 1 - Never  Interpreter Needed?: No  Information entered by :: Vina Ned, CMA   Activities of Daily Living     01/19/2024    3:11 PM  In your present state of health, do you have any difficulty performing the following activities:  Hearing? 0  Vision? 0  Difficulty concentrating or making decisions? 0  Walking or climbing stairs? 0  Dressing or bathing? 0  Doing errands, shopping? 0  Preparing Food and eating ? N  Using the Toilet? N  In the past six months, have you accidently leaked urine? N  Do you have problems with loss of bowel control? N  Managing your Medications? N  Managing your Finances? N  Housekeeping or managing your Housekeeping? N    Patient Care Team: Justus Leita DEL, MD as PCP - General (Internal Medicine) Allen Ned PARAS, MD as Referring Physician (Endocrinology) Cesario Barter, MD as Referring Physician (Cardiology) Novella Jeoffrey Morrison, NP as Nurse Practitioner (Cardiology) Brandy Odor, MD as Referring Physician (Gynecology) Hester Alm BROCKS, MD (Dermatology) Fernando Dale Betters, MD as Referring Physician (Ophthalmology) Elois, Curtiss Rav, MD as Referring Physician (Rheumatology)  I have updated your Care Teams any recent Medical Services you may have received from other providers in the past year.     Assessment:   This is a routine wellness examination for Maja.  Hearing/Vision screen Hearing Screening - Comments:: Denies hearing loss  Vision Screening - Comments:: Gets routine eye exams, [facility]   Goals Addressed             This Visit's Progress    Patient Stated       Maintain current health       Depression Screen     01/19/2024    3:34 PM 02/15/2023    8:44 AM 01/12/2023    2:47 PM 02/11/2022    8:56 AM 12/28/2021    1:29 PM 12/28/2021    1:23 PM 02/11/2021     8:24 AM  PHQ 2/9 Scores  PHQ - 2 Score  0 0 0 0 0 0  PHQ- 9 Score  2  0   0  Exception Documentation Patient refusal          Fall Risk     01/19/2024    3:39 PM 02/15/2023    8:46 AM 01/12/2023    2:36 PM 02/11/2022    8:57 AM 12/28/2021    1:29 PM  Fall Risk   Falls in the past year? 0 0 0 0 0  Number falls in past yr: 0 0 0 0 0  Injury with Fall? 0 0 0 0 0  Risk for fall due to : No Fall Risks No Fall Risks No Fall Risks No Fall Risks No Fall Risks  Follow up Falls evaluation completed Falls evaluation completed Falls prevention discussed;Education provided Falls evaluation completed  Falls evaluation completed      Data saved with a previous flowsheet row definition    MEDICARE RISK AT HOME:  Medicare Risk at Home Any stairs in or around the home?: Yes If so, are there any without handrails?: No Home free of loose throw rugs in walkways, pet beds, electrical cords, etc?: Yes Adequate lighting in your home to reduce risk of falls?: Yes Life alert?: No Use of a cane, walker or w/c?: No Grab bars in the bathroom?: Yes Shower chair or bench in shower?: No Elevated toilet seat or a handicapped toilet?: No  TIMED UP AND GO:  Was the test performed?  No  Cognitive Function: 6CIT completed        01/19/2024    3:40 PM 01/12/2023    3:04 PM 12/28/2021    1:31 PM  6CIT Screen  What Year? 0 points 0 points 0 points  What month? 0 points 0 points 0 points  What time? 0 points 0 points 0 points  Count back from 20 0 points 0 points 0 points  Months in reverse 0 points 0 points 0 points  Repeat phrase 0 points 0 points 0 points  Total Score 0 points 0 points 0 points    Immunizations Immunization History  Administered Date(s) Administered   Fluad Quad(high Dose 65+) 02/01/2019, 02/06/2020, 02/11/2021, 02/11/2022   Fluad Trivalent(High Dose 65+) 02/15/2023   Influenza,inj,Quad PF,6+ Mos 05/02/2015, 03/15/2016, 01/20/2017, 01/20/2018   Influenza-Unspecified 05/05/2015    PFIZER(Purple Top)SARS-COV-2 Vaccination 07/11/2019, 08/01/2019, 03/31/2020   Pfizer(Comirnaty)Fall Seasonal Vaccine 12 years and older 07/09/2022   Pneumococcal Conjugate-13 01/15/2016   Pneumococcal Polysaccharide-23 06/13/2014, 06/12/2019   Pneumococcal-Unspecified 06/13/2014   Tdap 11/22/2014   Zoster Recombinant(Shingrix ) 12/13/2022    Screening Tests Health Maintenance  Topic Date Due   COVID-19 Vaccine (5 - 2024-25 season) 01/30/2023   Zoster Vaccines- Shingrix  (2 of 2) 02/07/2023   INFLUENZA VACCINE  12/30/2023   MAMMOGRAM  03/16/2024   DTaP/Tdap/Td (2 - Td or Tdap) 11/21/2024   Medicare Annual Wellness (AWV)  01/18/2025   DEXA SCAN  01/14/2027   Colonoscopy  01/30/2032   Pneumococcal Vaccine: 50+ Years  Completed   Hepatitis C Screening  Completed   HPV VACCINES  Aged Out   Meningococcal B Vaccine  Aged Out    Health Maintenance  Health Maintenance Due  Topic Date Due   COVID-19 Vaccine (5 - 2024-25 season) 01/30/2023   Zoster Vaccines- Shingrix  (2 of 2) 02/07/2023   INFLUENZA VACCINE  12/30/2023   Health Maintenance Items Addressed: Mammogram ordered, See Nurse Notes at the end of this note  Additional Screening:  Vision Screening: Recommended annual ophthalmology exams for early detection of glaucoma and other disorders of  the eye. Would you like a referral to an eye doctor? No    Dental Screening: Recommended annual dental exams for proper oral hygiene  Community Resource Referral / Chronic Care Management: CRR required this visit?  No   CCM required this visit?  No   Plan:    I have personally reviewed and noted the following in the patient's chart:   Medical and social history Use of alcohol, tobacco or illicit drugs  Current medications and supplements including opioid prescriptions. Patient is not currently taking opioid prescriptions. Functional ability and status Nutritional status Physical activity Advanced directives List of other  physicians Hospitalizations, surgeries, and ER visits in previous 12 months Vitals Screenings to include cognitive, depression, and falls Referrals and appointments  In addition, I have reviewed and discussed with patient certain preventive protocols, quality metrics, and best practice recommendations. A written personalized care plan for preventive services as well as general preventive health recommendations were provided to patient.   Vina Ned, CMA   01/19/2024   After Visit Summary: (MyChart) Due to this being a telephonic visit, the after visit summary with patients personalized plan was offered to patient via MyChart   Notes:  DEXA scheduled for 02/01/24 (follow by Endocrinology) Placed order for a MMG (due ~03/16/24) Colonoscopy followed by [facility] Patient declined depression screening Flu and Covid vaccines in the fall Shingles vaccines UTD per patient

## 2024-01-19 NOTE — Patient Instructions (Signed)
 Ms. Carmen Graves , Thank you for taking time out of your busy schedule to complete your Annual Wellness Visit with me. I enjoyed our conversation and look forward to speaking with you again next year. I, as well as your care team,  appreciate your ongoing commitment to your health goals. Please review the following plan we discussed and let me know if I can assist you in the future. Your Game plan/ To Do List    Referrals: None  Follow up Visits: We will see or speak with you next year for your Next Medicare AWV with our clinical staff Have you seen your provider in the last 6 months (3 months if uncontrolled diabetes)? No  Clinician Recommendations: Get the flu and covid vaccines at your convenience. Aim for 30 minutes of exercise or brisk walking, 6-8 glasses of water, and 5 servings of fruits and vegetables each day.       Please call to schedule your mammogram:  [facility] at [facility] Address: [address], [place], [place] Phone: [phone]  [facility] at [facility] [address], [address] [place], [place] [address] Phone: [phone]    This is a list of the screenings recommended for you:  Health Maintenance  Topic Date Due   COVID-19 Vaccine (5 - 2024-25 season) 01/30/2023   Zoster (Shingles) Vaccine (2 of 2) 02/07/2023   Flu Shot  12/30/2023   Mammogram  03/16/2024   DTaP/Tdap/Td vaccine (2 - Td or Tdap) 11/21/2024   Medicare Annual Wellness Visit  01/18/2025   DEXA scan (bone density measurement)  01/14/2027   Colon Cancer Screening  01/30/2032   Pneumococcal Vaccine for age over 68  Completed   Hepatitis C Screening  Completed   HPV Vaccine  Aged Out   Meningitis B Vaccine  Aged Out    Advanced directives: (ACP Link)Information on Advanced Care Planning can be found at [place]  Secretary of [facility] Advance Health Care Directives Advance Health Care Directives. [email or web address] You may also get the forms at your doctor's  office Advance Care Planning is important because it:  [x]  Makes sure you receive the medical care that is consistent with your values, goals, and preferences  [x]  It provides guidance to your family and loved ones and reduces their decisional burden about whether or not they are making the right decisions based on your wishes.  Follow the link provided in your after visit summary or read over the paperwork we have mailed to you to help you started getting your Advance Directives in place. If you need assistance in completing these, please reach out to us  so that we can help you!  See attachments for Preventive Care and Fall Prevention Tips.   Fall Prevention in the Home, Adult Falls can cause injuries and affect people of all ages. There are many simple things that you can do to make your home safe and to help prevent falls. If you need it, ask for help making these changes. What actions can I take to prevent falls? General information Use good lighting in all rooms. Make sure to: Replace any light bulbs that burn out. Turn on lights if it is dark and use night-lights. Keep items that you use often in easy-to-reach places. Lower the shelves around your home if needed. Move furniture so that there are clear paths around it. Do not keep throw rugs or other things on the floor that can make you trip. If any of your floors are  uneven, fix them. Add color or contrast paint or tape to clearly mark and help you see: Grab bars or handrails. First and last steps of staircases. Where the edge of each step is. If you use a ladder or stepladder: Make sure that it is fully opened. Do not climb a closed ladder. Make sure the sides of the ladder are locked in place. Have someone hold the ladder while you use it. Know where your pets are as you move through your home. What can I do in the bathroom?     Keep the floor dry. Clean up any water that is on the floor right away. Remove soap buildup  in the bathtub or shower. Buildup makes bathtubs and showers slippery. Use non-skid mats or decals on the floor of the bathtub or shower. Attach bath mats securely with double-sided, non-slip rug tape. If you need to sit down while you are in the shower, use a non-slip stool. Install grab bars by the toilet and in the bathtub and shower. Do not use towel bars as grab bars. What can I do in the bedroom? Make sure that you have a light by your bed that is easy to reach. Do not use any sheets or blankets on your bed that hang to the floor. Have a firm bench or chair with side arms that you can use for support when you get dressed. What can I do in the kitchen? Clean up any spills right away. If you need to reach something above you, use a sturdy step stool that has a grab bar. Keep electrical cables out of the way. Do not use floor polish or wax that makes floors slippery. What can I do with my stairs? Do not leave anything on the stairs. Make sure that you have a light switch at the top and the bottom of the stairs. Have them installed if you do not have them. Make sure that there are handrails on both sides of the stairs. Fix handrails that are broken or loose. Make sure that handrails are as long as the staircases. Install non-slip stair treads on all stairs in your home if they do not have carpet. Avoid having throw rugs at the top or bottom of stairs, or secure the rugs with carpet tape to prevent them from moving. Choose a carpet design that does not hide the edge of steps on the stairs. Make sure that carpet is firmly attached to the stairs. Fix any carpet that is loose or worn. What can I do on the outside of my home? Use bright outdoor lighting. Repair the edges of walkways and driveways and fix any cracks. Clear paths of anything that can make you trip, such as tools or rocks. Add color or contrast paint or tape to clearly mark and help you see high doorway thresholds. Trim any bushes  or trees on the main path into your home. Check that handrails are securely fastened and in good repair. Both sides of all steps should have handrails. Install guardrails along the edges of any raised decks or porches. Have leaves, snow, and ice cleared regularly. Use sand, salt, or ice melt on walkways during winter months if you live where there is ice and snow. In the garage, clean up any spills right away, including grease or oil spills. What other actions can I take? Review your medicines with your health care provider. Some medicines can make you confused or feel dizzy. This can increase your chance of falling. Wear  closed-toe shoes that fit well and support your feet. Wear shoes that have rubber soles and low heels. Use a cane, walker, scooter, or crutches that help you move around if needed. Talk with your provider about other ways that you can decrease your risk of falls. This may include seeing a physical therapist to learn to do exercises to improve movement and strength. Where to find more information Centers for Disease Control and Prevention, STEADI: TonerPromos.no [employer] on Aging: BaseRingTones.pl National Institute on Aging: BaseRingTones.pl Contact a health care provider if: You are afraid of falling at home. You feel weak, drowsy, or dizzy at home. You fall at home. Get help right away if you: Lose consciousness or have trouble moving after a fall. Have a fall that causes a head injury. These symptoms may be an emergency. Get help right away. Call 911. Do not wait to see if the symptoms will go away. Do not drive yourself to the hospital. This information is not intended to replace advice given to you by your health care provider. Make sure you discuss any questions you have with your health care provider. Document Revised: 01/18/2022 Document Reviewed: 01/18/2022 Elsevier Patient Education  2024 [employer].

## 2024-01-23 ENCOUNTER — Ambulatory Visit (INDEPENDENT_AMBULATORY_CARE_PROVIDER_SITE_OTHER): Admitting: Internal Medicine

## 2024-01-23 ENCOUNTER — Encounter: Payer: Self-pay | Admitting: Internal Medicine

## 2024-01-23 VITALS — BP 122/76 | HR 97 | Ht 64.0 in | Wt 139.0 lb

## 2024-01-23 DIAGNOSIS — I421 Obstructive hypertrophic cardiomyopathy: Secondary | ICD-10-CM

## 2024-01-23 DIAGNOSIS — I480 Paroxysmal atrial fibrillation: Secondary | ICD-10-CM

## 2024-01-23 DIAGNOSIS — S00412A Abrasion of left ear, initial encounter: Secondary | ICD-10-CM

## 2024-01-23 DIAGNOSIS — H6993 Unspecified Eustachian tube disorder, bilateral: Secondary | ICD-10-CM | POA: Diagnosis not present

## 2024-01-23 NOTE — Assessment & Plan Note (Signed)
 Stable in SR at this time. Seen by recently by Cardiology - no medication changes made

## 2024-01-23 NOTE — Patient Instructions (Addendum)
 Use Flonase or Nasacort nasal spray over the counter daily  Call [facility] to schedule your mammogram at [phone].

## 2024-01-23 NOTE — Progress Notes (Signed)
 Date:  01/23/2024   Name:  Carmen Graves   DOB:  12-Apr-1956   MRN:  [id number]   Chief Complaint: Ear Pain (Bilateral ear pain- left I worse. Patient said she is having drainage out of ears. She does have a cough that started 2 months ago with post nasal drainage. )  URI  This is a new problem. The current episode started 1 to 4 weeks ago. The problem has been unchanged. There has been no fever. Associated symptoms include congestion, ear pain and a plugged ear sensation. Pertinent negatives include no chest pain, coughing, sore throat or wheezing. She has tried decongestant, acetaminophen and steam for the symptoms. The treatment provided no relief.    Review of Systems  Constitutional:  Negative for chills, fatigue and fever.  HENT:  Positive for congestion, ear pain and sinus pressure. Negative for sore throat and trouble swallowing.   Respiratory:  Negative for cough, chest tightness, shortness of breath and wheezing.   Cardiovascular:  Negative for chest pain and palpitations.     Lab Results  Component Value Date   NA 141 02/15/2023   K 4.3 02/15/2023   CO2 21 02/15/2023   GLUCOSE 88 02/15/2023   BUN 14 02/15/2023   CREATININE 0.71 02/15/2023   CALCIUM 9.3 02/15/2023   EGFR 92 02/15/2023   GFRNONAA 93 02/06/2020   Lab Results  Component Value Date   CHOL 191 02/15/2023   HDL 48 02/15/2023   LDLCALC 111 (H) 02/15/2023   TRIG 185 (H) 02/15/2023   CHOLHDL 4.0 02/15/2023   Lab Results  Component Value Date   TSH 0.81 01/04/2023   No results found for: HGBA1C Lab Results  Component Value Date   WBC 7.3 01/04/2023   HGB 13.7 01/04/2023   HCT 43 01/04/2023   MCV 93 02/11/2022   PLT 232 01/04/2023   Lab Results  Component Value Date   ALT 29 01/04/2023   AST 25 01/04/2023   ALKPHOS 64 01/04/2023   BILITOT 0.6 02/11/2022   Lab Results  Component Value Date   VD25OH 49.0 02/15/2023     Patient Active Problem List   Diagnosis Date Noted   Slow transit  constipation 02/11/2022   Vitamin D  deficiency 09/21/2021   Iron deficiency anemia 09/21/2021   Displacement of cervical intervertebral disc 09/21/2021   Acquired thrombophilia (HCC) 02/11/2021   Need for prophylactic antibiotic 04/08/2016   Paroxysmal atrial fibrillation (HCC) 06/20/2015   Abnormal genetic test 05/08/2015   Presence of automatic cardioverter/defibrillator (AICD) 03/13/2015   Hyperlipidemia, mild 03/05/2015   Osteoporosis 03/05/2015   Hypertrophic obstructive cardiomyopathy (HCC) 02/27/2015    Allergies  Allergen Reactions   Albuterol Shortness Of Breath    Chest pain and SOB   Budesonide-Formoterol Fumarate Shortness Of Breath    Chest pain, severe SOB    Fluticasone-Salmeterol Shortness Of Breath   Ipratropium-Albuterol Shortness Of Breath    Chest pain   Celecoxib     Other reaction(s): Kidney Disorder   Codeine  Nausea Only   Nsaids Other (See Comments)    Causes kidney disease    Penicillins    Prednisone Other (See Comments)    Chest pain and tightness   Caffeine Palpitations    Past Surgical History:  Procedure Laterality Date   BLEPHAROPLASTY Bilateral 07/2022   BONY PELVIS SURGERY     tore muscle from pelvic bone   BREAST BIOPSY Bilateral 15+ YRS AGO   NEG   BREAST CYST ASPIRATION Bilateral  CARDIAC DEFIBRILLATOR PLACEMENT  01/2015   for HOCM   MYOMECTOMY  06/2015   TONSILLECTOMY AND ADENOIDECTOMY     UMBILICAL HERNIA REPAIR      Social History   Tobacco Use   Smoking status: Never   Smokeless tobacco: Never  Vaping Use   Vaping status: Never Used  Substance Use Topics   Alcohol use: No    Alcohol/week: 0.0 standard drinks of alcohol   Drug use: No     Medication list has been reviewed and updated.  Current Meds  Medication Sig   acetaminophen (TYLENOL) 650 MG CR tablet Take by mouth.   apixaban (ELIQUIS) 5 MG TABS tablet Take 5 mg by mouth 2 (two) times daily.    Ascorbic Acid (VITAMIN C) 100 MG CHEW    azithromycin   (ZITHROMAX ) 250 MG tablet Take 250 mg by mouth once. Prior to dental treatment   Boswellia Serrata (BOSWELLIA PO) Take by mouth. Pt takes 3 in am and 3 in pm   calcium citrate-vitamin D  (CITRACAL+D) 315-200 MG-UNIT tablet Take 1 tablet by mouth daily.   Cholecalciferol (VITAMIN D3) 50 MCG (2000 UT) capsule Take 1 capsule by mouth daily. 2000 units   diltiazem (CARDIZEM) 30 MG tablet Take 30 mg by mouth as needed.   Glucosamine 500 MG CAPS Take by mouth. Pt takes 1000 mg BID   Loteprednol Etabonate (LOTEMAX SM) 0.38 % GEL Apply 1 drop to eye daily. X 20 days, take 1 week break and repeat   MAGNESIUM PO Take by mouth.   melatonin 1 MG TABS tablet Take 1 mg by mouth at bedtime. (Patient taking differently: Take 1 mg by mouth at bedtime as needed.)   metoprolol succinate (TOPROL-XL) 25 MG 24 hr tablet Take 25 mg by mouth daily. Pt taking 1/2 tablet 2 times a day   risedronate (ACTONEL) 150 MG tablet Take 150 mg by mouth every 30 (thirty) days. with water on empty stomach, nothing by mouth or lie down for next 30 minutes. For bone density   S-Adenosylmethionine (SAM-E PO) Take by mouth. For arthritis (Patient taking differently: Take 400 mg by mouth daily. For arthritis)   S-Adenosylmethionine 200 MG TBEC Take by mouth.   sertraline  (ZOLOFT ) 25 MG tablet Take 1 tablet (25 mg total) by mouth daily.   vitamin C (ASCORBIC ACID) 500 MG tablet Take 500 mg by mouth daily.       01/23/2024    3:20 PM 02/15/2023    8:44 AM 02/11/2022    8:57 AM 02/11/2021    8:24 AM  GAD 7 : Generalized Anxiety Score  Nervous, Anxious, on Edge 0 0 2 3  Control/stop worrying 0 0 0 3  Worry too much - different things 0 0 0 3  Trouble relaxing 0 0 0 3  Restless 0 0 0 3  Easily annoyed or irritable 0 0 0 3  Afraid - awful might happen 0 0 0 3  Total GAD 7 Score 0 0 2 21  Anxiety Difficulty Not difficult at all Not difficult at all Not difficult at all        01/23/2024    3:20 PM 02/15/2023    8:44 AM 01/12/2023     2:47 PM  Depression screen PHQ 2/9  Decreased Interest 0 0 0  Down, Depressed, Hopeless 0 0 0  PHQ - 2 Score 0 0 0  Altered sleeping 2 2   Tired, decreased energy 0 0   Change in appetite 0 0  Feeling bad or failure about yourself  0 0   Trouble concentrating 0 0   Moving slowly or fidgety/restless 0 0   Suicidal thoughts 0 0   PHQ-9 Score 2 2   Difficult doing work/chores Not difficult at all Not difficult at all     BP Readings from Last 3 Encounters:  01/23/24 122/76  02/15/23 118/80  02/11/22 118/70    Physical Exam Constitutional:      Appearance: Normal appearance.  HENT:     Head:     Jaw: There is normal jaw occlusion.     Right Ear: No middle ear effusion. Tympanic membrane is retracted. Tympanic membrane is not perforated or erythematous.     Left Ear: Tympanic membrane is retracted. Tympanic membrane is not perforated or erythematous.     Ears:     Comments: Left canal with excoriation and scant dried blood Cardiovascular:     Rate and Rhythm: Normal rate and regular rhythm.  Pulmonary:     Effort: Pulmonary effort is normal.     Breath sounds: No wheezing.  Musculoskeletal:     Cervical back: Normal range of motion.  Neurological:     Mental Status: She is alert.     Wt Readings from Last 3 Encounters:  01/23/24 139 lb (63 kg)  01/19/24 132 lb (59.9 kg)  02/15/23 135 lb (61.2 kg)    BP 122/76   Pulse 97   Ht 5' 4 (1.626 m)   Wt 139 lb (63 kg)   SpO2 96%   BMI 23.86 kg/m   Assessment and Plan:  Problem List Items Addressed This Visit       Unprioritized   Paroxysmal atrial fibrillation (HCC) (Chronic)   Stable in SR at this time. Seen by recently by Cardiology - no medication changes made       Hypertrophic obstructive cardiomyopathy (HCC) (Chronic)   Doing well s/p cardiac surgery and AICD placement Blood pressure controlled, minimal chest discomfort She continues on Eliquis and metoprolol daily.      Other Visit Diagnoses        Dysfunction of both eustachian tubes    -  Primary   continue Coricidin HBP daily and add Flonase or Nasacort spray daily use heat for comfort     Ear canal abrasion, left, initial encounter       pt admits to using her finger to feel in the canal. likely caused the abrasion - should heal without issue - no specific treatment is needed       No follow-ups on file.    Leita HILARIO Adie, MD [facility] [place]

## 2024-01-23 NOTE — Assessment & Plan Note (Addendum)
 Doing well s/p cardiac surgery and AICD placement Blood pressure controlled, minimal chest discomfort She continues on Eliquis and metoprolol daily.

## 2024-02-17 ENCOUNTER — Ambulatory Visit (INDEPENDENT_AMBULATORY_CARE_PROVIDER_SITE_OTHER): Payer: Self-pay | Admitting: Internal Medicine

## 2024-02-17 ENCOUNTER — Encounter: Payer: Self-pay | Admitting: Internal Medicine

## 2024-02-17 VITALS — BP 118/86 | HR 77 | Temp 98.0°F | Resp 18 | Ht 64.0 in | Wt 136.0 lb

## 2024-02-17 DIAGNOSIS — Z1231 Encounter for screening mammogram for malignant neoplasm of breast: Secondary | ICD-10-CM

## 2024-02-17 DIAGNOSIS — E785 Hyperlipidemia, unspecified: Secondary | ICD-10-CM

## 2024-02-17 DIAGNOSIS — I421 Obstructive hypertrophic cardiomyopathy: Secondary | ICD-10-CM | POA: Diagnosis not present

## 2024-02-17 DIAGNOSIS — Z23 Encounter for immunization: Secondary | ICD-10-CM

## 2024-02-17 DIAGNOSIS — M810 Age-related osteoporosis without current pathological fracture: Secondary | ICD-10-CM

## 2024-02-17 DIAGNOSIS — E559 Vitamin D deficiency, unspecified: Secondary | ICD-10-CM

## 2024-02-17 DIAGNOSIS — D6869 Other thrombophilia: Secondary | ICD-10-CM

## 2024-02-17 DIAGNOSIS — D508 Other iron deficiency anemias: Secondary | ICD-10-CM

## 2024-02-17 DIAGNOSIS — F411 Generalized anxiety disorder: Secondary | ICD-10-CM | POA: Insufficient documentation

## 2024-02-17 MED ORDER — SERTRALINE HCL 25 MG PO TABS: 25.0000 mg | ORAL_TABLET | Freq: Every day | ORAL | 3 refills | Status: AC

## 2024-02-17 MED ORDER — AZITHROMYCIN 250 MG PO TABS: 250.0000 mg | ORAL_TABLET | Freq: Once | ORAL | 0 refills | Status: AC

## 2024-02-17 MED ORDER — COVID-19 MRNA VAC-TRIS(PFIZER) 30 MCG/0.3ML IM SUSY: 0.3000 mL | PREFILLED_SYRINGE | Freq: Once | INTRAMUSCULAR | 0 refills | Status: AC

## 2024-02-17 NOTE — Assessment & Plan Note (Addendum)
 Being followed by [facility]. On Actonel with calcium and vitamin D  DEXA 01/31/24 showed improvement from previous. Plans to take a year off of Actonel.

## 2024-02-17 NOTE — Assessment & Plan Note (Signed)
Continue daily supplement 

## 2024-02-17 NOTE — Assessment & Plan Note (Signed)
 Recheck CBC and advise

## 2024-02-17 NOTE — Assessment & Plan Note (Signed)
 Doing very well; no chest pain or shortness of breath Staying active. AICD in place - will need replacement next year

## 2024-02-17 NOTE — Assessment & Plan Note (Signed)
 On Eliquis without any bleeding or bruising issues

## 2024-02-17 NOTE — Assessment & Plan Note (Signed)
 Continue dietary changes. Consider statin if lipids are increasing

## 2024-02-17 NOTE — Patient Instructions (Signed)
 Call [facility] to schedule your mammogram at [phone].

## 2024-02-17 NOTE — Progress Notes (Signed)
 Date:  02/17/2024   Name:  Carmen Graves   DOB:  [birth year]   MRN:  [id number]   Chief Complaint: Annual Exam Carmen Graves is a 68 y.o. female who presents today for her Complete Annual Exam. She feels well. She reports exercising walking, yoga, gardening. She reports she is sleeping well. Breast complaints none.  Health Maintenance  Topic Date Due   Zoster (Shingles) Vaccine (2 of 2) 02/07/2023   COVID-19 Vaccine (5 - 2025-26 season) 01/30/2024   Breast Cancer Screening  03/16/2024   DTaP/Tdap/Td vaccine (2 - Td or Tdap) 11/21/2024   Medicare Annual Wellness Visit  01/18/2025   Colon Cancer Screening  01/29/2025   DEXA scan (bone density measurement)  01/14/2027   Pneumococcal Vaccine for age over 39  Completed   Flu Shot  Completed   Hepatitis C Screening  Completed   HPV Vaccine  Aged Out   Meningitis B Vaccine  Aged Out    Heart Problem This is a chronic problem. The problem has been unchanged. Pertinent negatives include no abdominal pain, arthralgias, chest pain, coughing, fatigue, headaches, myalgias or weakness. Treatments tried: metoprolol and Eliquis.  Anxiety Presents for follow-up visit. Patient reports no chest pain, dizziness, palpitations or shortness of breath. The quality of sleep is good.   Compliance with medications is 76-100%.    Review of Systems  Constitutional:  Negative for fatigue and unexpected weight change.  HENT:  Negative for trouble swallowing.   Eyes:  Negative for visual disturbance.  Respiratory:  Negative for cough, chest tightness, shortness of breath and wheezing.   Cardiovascular:  Negative for chest pain, palpitations and leg swelling.  Gastrointestinal:  Negative for abdominal pain, constipation and diarrhea.  Musculoskeletal:  Negative for arthralgias and myalgias.  Neurological:  Negative for dizziness, weakness, light-headedness and headaches.     Lab Results  Component Value Date   NA 141 02/15/2023   K 4.3 02/15/2023    CO2 21 02/15/2023   GLUCOSE 88 02/15/2023   BUN 14 02/15/2023   CREATININE 0.71 02/15/2023   CALCIUM 9.3 02/15/2023   EGFR 92 02/15/2023   GFRNONAA 93 02/06/2020   Lab Results  Component Value Date   CHOL 191 02/15/2023   HDL 48 02/15/2023   LDLCALC 111 (H) 02/15/2023   TRIG 185 (H) 02/15/2023   CHOLHDL 4.0 02/15/2023   Lab Results  Component Value Date   TSH 0.81 01/04/2023   No results found for: HGBA1C Lab Results  Component Value Date   WBC 7.3 01/04/2023   HGB 13.7 01/04/2023   HCT 43 01/04/2023   MCV 93 02/11/2022   PLT 232 01/04/2023   Lab Results  Component Value Date   ALT 29 01/04/2023   AST 25 01/04/2023   ALKPHOS 64 01/04/2023   BILITOT 0.6 02/11/2022   Lab Results  Component Value Date   VD25OH 49.0 02/15/2023     Patient Active Problem List   Diagnosis Date Noted   Generalized anxiety disorder 02/17/2024   Slow transit constipation 02/11/2022   Vitamin D  deficiency 09/21/2021   Iron deficiency anemia 09/21/2021   Displacement of cervical intervertebral disc 09/21/2021   Acquired thrombophilia (HCC) 02/11/2021   Need for prophylactic antibiotic 04/08/2016   Paroxysmal atrial fibrillation (HCC) 06/20/2015   Abnormal genetic test 05/08/2015   Presence of automatic cardioverter/defibrillator (AICD) 03/13/2015   Hyperlipidemia, mild 03/05/2015   Osteoporosis 03/05/2015   Hypertrophic obstructive cardiomyopathy (HCC) 02/27/2015    Allergies  Allergen Reactions  Albuterol Shortness Of Breath    Chest pain and SOB   Budesonide-Formoterol Fumarate Shortness Of Breath    Chest pain, severe SOB    Fluticasone-Salmeterol Shortness Of Breath   Ipratropium-Albuterol Shortness Of Breath    Chest pain   Celecoxib     Other reaction(s): Kidney Disorder   Codeine  Nausea Only   Nsaids Other (See Comments)    Causes kidney disease    Penicillins    Prednisone Other (See Comments)    Chest pain and tightness   Caffeine Palpitations    Past  Surgical History:  Procedure Laterality Date   BLEPHAROPLASTY Bilateral 07/2022   BONY PELVIS SURGERY     tore muscle from pelvic bone   BREAST BIOPSY Bilateral 15+ YRS AGO   NEG   BREAST CYST ASPIRATION Bilateral    CARDIAC DEFIBRILLATOR PLACEMENT  01/2015   for HOCM   EYE SURGERY     2024   HERNIA REPAIR     age 84   MYOMECTOMY  06/2015   TONSILLECTOMY AND ADENOIDECTOMY     UMBILICAL HERNIA REPAIR      Social History   Tobacco Use   Smoking status: Never    Passive exposure: Never   Smokeless tobacco: Never  Vaping Use   Vaping status: Never Used  Substance Use Topics   Alcohol use: No   Drug use: Never     Medication list has been reviewed and updated.  Current Meds  Medication Sig   acetaminophen (TYLENOL) 650 MG CR tablet Take by mouth.   apixaban (ELIQUIS) 5 MG TABS tablet Take 5 mg by mouth 2 (two) times daily.    Boswellia Serrata (BOSWELLIA PO) Take by mouth. Pt takes 3 in am and 3 in pm   calcium citrate-vitamin D  (CITRACAL+D) 315-200 MG-UNIT tablet Take 1 tablet by mouth daily.   Cholecalciferol (VITAMIN D3) 50 MCG (2000 UT) capsule Take 1 capsule by mouth daily. 2000 units   COVID-19 mRNA vaccine, Pfizer, (COMIRNATY) syringe Inject 0.3 mLs into the muscle once for 1 dose.   diltiazem (CARDIZEM) 30 MG tablet Take 30 mg by mouth as needed.   Glucosamine 500 MG CAPS Take by mouth. Pt takes 1000 mg BID   Loteprednol Etabonate (LOTEMAX SM) 0.38 % GEL Apply 1 drop to eye daily. X 20 days, take 1 week break and repeat   MAGNESIUM PO Take by mouth.   metoprolol succinate (TOPROL-XL) 25 MG 24 hr tablet Take 25 mg by mouth daily. Pt taking 1/2 tablet 2 times a day (Patient taking differently: Take 12.5 mg by mouth in the morning and at bedtime. Pt taking 1/2 tablet 2 times a day)   vitamin C (ASCORBIC ACID) 500 MG tablet Take 500 mg by mouth daily.   [DISCONTINUED] azithromycin  (ZITHROMAX ) 250 MG tablet Take 250 mg by mouth once. Prior to dental treatment        01/23/2024    3:20 PM 02/15/2023    8:44 AM 02/11/2022    8:57 AM 02/11/2021    8:24 AM  GAD 7 : Generalized Anxiety Score  Nervous, Anxious, on Edge 0 0 2 3  Control/stop worrying 0 0 0 3  Worry too much - different things 0 0 0 3  Trouble relaxing 0 0 0 3  Restless 0 0 0 3  Easily annoyed or irritable 0 0 0 3  Afraid - awful might happen 0 0 0 3  Total GAD 7 Score 0 0 2 21  Anxiety Difficulty  Not difficult at all Not difficult at all Not difficult at all        02/17/2024    8:25 AM 01/23/2024    3:20 PM 02/15/2023    8:44 AM  Depression screen PHQ 2/9  Decreased Interest 0 0 0  Down, Depressed, Hopeless 0 0 0  PHQ - 2 Score 0 0 0  Altered sleeping 0 2 2  Tired, decreased energy 0 0 0  Change in appetite 0 0 0  Feeling bad or failure about yourself  0 0 0  Trouble concentrating 0 0 0  Moving slowly or fidgety/restless 0 0 0  Suicidal thoughts 0 0 0  PHQ-9 Score 0 2 2  Difficult doing work/chores Not difficult at all Not difficult at all Not difficult at all    BP Readings from Last 3 Encounters:  02/17/24 118/86  01/23/24 122/76  02/15/23 118/80    Physical Exam Vitals and nursing note reviewed.  Constitutional:      General: She is not in acute distress.    Appearance: She is well-developed.  HENT:     Head: Normocephalic and atraumatic.     Right Ear: Tympanic membrane and ear canal normal.     Left Ear: Tympanic membrane and ear canal normal.     Nose:     Right Sinus: No maxillary sinus tenderness.     Left Sinus: No maxillary sinus tenderness.  Eyes:     General: No scleral icterus.       Right eye: No discharge.        Left eye: No discharge.     Conjunctiva/sclera: Conjunctivae normal.  Neck:     Thyroid : No thyromegaly.     Vascular: No carotid bruit.  Cardiovascular:     Rate and Rhythm: Normal rate and regular rhythm.     Pulses: Normal pulses.     Heart sounds: Normal heart sounds.  Pulmonary:     Effort: Pulmonary effort is normal. No  respiratory distress.     Breath sounds: No wheezing.  Chest:       Comments: AICD site healed Abdominal:     General: Bowel sounds are normal.     Palpations: Abdomen is soft.     Tenderness: There is no abdominal tenderness.  Musculoskeletal:     Cervical back: Normal range of motion. No erythema.     Right lower leg: No edema.     Left lower leg: No edema.  Lymphadenopathy:     Cervical: No cervical adenopathy.  Skin:    General: Skin is warm and dry.     Findings: No rash.  Neurological:     Mental Status: She is alert and oriented to person, place, and time.     Cranial Nerves: No cranial nerve deficit.     Sensory: No sensory deficit.     Deep Tendon Reflexes: Reflexes are normal and symmetric.  Psychiatric:        Attention and Perception: Attention normal.        Mood and Affect: Mood normal.     Wt Readings from Last 3 Encounters:  02/17/24 136 lb (61.7 kg)  01/23/24 139 lb (63 kg)  01/19/24 132 lb (59.9 kg)    BP 118/86 (BP Location: Left Arm, Patient Position: Sitting, Cuff Size: Normal)   Pulse 77   Temp 98 F (36.7 C) (Oral)   Resp 18   Ht 5' 4 (1.626 m)   Wt 136 lb (61.7 kg)  SpO2 99%   BMI 23.34 kg/m   Assessment and Plan:  Problem List Items Addressed This Visit       Unprioritized   Acquired thrombophilia (HCC) (Chronic)   On Eliquis without any bleeding or bruising issues      Generalized anxiety disorder   Clinically stable on Sertraline .   No SI or HI on evaluation. Plan to continue same medications for now.       Relevant Medications   sertraline  (ZOLOFT ) 25 MG tablet   Other Relevant Orders   TSH   Hyperlipidemia, mild (Chronic)   Continue dietary changes. Consider statin if lipids are increasing      Relevant Orders   Lipid panel   Hypertrophic obstructive cardiomyopathy (HCC) - Primary (Chronic)   Doing very well; no chest pain or shortness of breath Staying active. AICD in place - will need replacement next year       Relevant Orders   CBC with Differential/Platelet   Comprehensive metabolic panel with GFR   TSH   Iron deficiency anemia   Recheck CBC and advise      Relevant Orders   CBC with Differential/Platelet   Osteoporosis (Chronic)   Being followed by [facility]. On Actonel with calcium and vitamin D  DEXA 01/31/24 showed improvement from previous. Plans to take a year off of Actonel.       Relevant Orders   VITAMIN D  25 Hydroxy (Vit-D Deficiency, Fractures)   Vitamin D  deficiency   Continue daily supplement.      Relevant Orders   VITAMIN D  25 Hydroxy (Vit-D Deficiency, Fractures)   Other Visit Diagnoses       Encounter for screening mammogram for breast cancer       to be scheduled at [facility] next month     Immunization due       Relevant Medications   COVID-19 mRNA vaccine, Pfizer, (COMIRNATY) syringe   Other Relevant Orders   Flu vaccine HIGH DOSE PF(Fluzone Trivalent) (Completed)   Pneumococcal conjugate vaccine 20-valent (Completed)       Return in about 7 months (around 09/16/2024) for TOC  20 min visit Dr. Lemon.    Leita HILARIO Adie, MD [facility] [place]

## 2024-02-17 NOTE — Assessment & Plan Note (Signed)
 Clinically stable on Sertraline.   No SI or HI on evaluation. Plan to continue same medications for now.

## 2024-02-18 LAB — CBC WITH DIFFERENTIAL/PLATELET
Basophils Absolute: 0.1 x10E3/uL (ref 0.0–0.2)
Basos: 1 %
EOS (ABSOLUTE): 0.1 x10E3/uL (ref 0.0–0.4)
Eos: 1 %
Hematocrit: 41.8 % (ref 34.0–46.6)
Hemoglobin: 13.6 g/dL (ref 11.1–15.9)
Immature Grans (Abs): 0 x10E3/uL (ref 0.0–0.1)
Immature Granulocytes: 0 %
Lymphocytes Absolute: 1.5 x10E3/uL (ref 0.7–3.1)
Lymphs: 28 %
MCH: 30.5 pg (ref 26.6–33.0)
MCHC: 32.5 g/dL (ref 31.5–35.7)
MCV: 94 fL (ref 79–97)
Monocytes Absolute: 0.4 x10E3/uL (ref 0.1–0.9)
Monocytes: 7 %
Neutrophils Absolute: 3.4 x10E3/uL (ref 1.4–7.0)
Neutrophils: 63 %
Platelets: 211 x10E3/uL (ref 150–450)
RBC: 4.46 x10E6/uL (ref 3.77–5.28)
RDW: 12.8 % (ref 11.7–15.4)
WBC: 5.4 x10E3/uL (ref 3.4–10.8)

## 2024-02-18 LAB — COMPREHENSIVE METABOLIC PANEL WITH GFR
ALT: 24 IU/L (ref 0–32)
AST: 19 IU/L (ref 0–40)
Albumin: 4.4 g/dL (ref 3.9–4.9)
Alkaline Phosphatase: 66 IU/L (ref 49–135)
BUN/Creatinine Ratio: 21 (ref 12–28)
BUN: 15 mg/dL (ref 8–27)
Bilirubin Total: 0.5 mg/dL (ref 0.0–1.2)
CO2: 23 mmol/L (ref 20–29)
Calcium: 9.3 mg/dL (ref 8.7–10.3)
Chloride: 105 mmol/L (ref 96–106)
Creatinine, Ser: 0.71 mg/dL (ref 0.57–1.00)
Globulin, Total: 2.3 g/dL (ref 1.5–4.5)
Glucose: 91 mg/dL (ref 70–99)
Potassium: 4.5 mmol/L (ref 3.5–5.2)
Sodium: 140 mmol/L (ref 134–144)
Total Protein: 6.7 g/dL (ref 6.0–8.5)
eGFR: 91 mL/min/1.73 (ref >59)

## 2024-02-18 LAB — LIPID PANEL
Chol/HDL Ratio: 3.8 ratio (ref 0.0–4.4)
Cholesterol, Total: 185 mg/dL (ref 100–199)
HDL: 49 mg/dL (ref >39)
LDL Chol Calc (NIH): 112 mg/dL — ABNORMAL HIGH (ref 0–99)
Triglycerides: 134 mg/dL (ref 0–149)
VLDL Cholesterol Cal: 24 mg/dL (ref 5–40)

## 2024-02-18 LAB — TSH: TSH: 0.63 u[IU]/mL (ref 0.450–4.500)

## 2024-02-18 LAB — VITAMIN D 25 HYDROXY (VIT D DEFICIENCY, FRACTURES): Vit D, 25-Hydroxy: 39.7 ng/mL (ref 30.0–100.0)

## 2024-02-19 ENCOUNTER — Ambulatory Visit: Payer: Self-pay | Admitting: Internal Medicine

## 2024-09-17 ENCOUNTER — Encounter: Admitting: Student

## 2025-01-31 ENCOUNTER — Ambulatory Visit
